# Patient Record
Sex: Male | Born: 1960 | Race: White | Hispanic: No | Marital: Single | Smoking: Current every day smoker
Health system: Southern US, Community
[De-identification: ages and names within clinical notes are randomized; demographics above are authoritative.]

## PROBLEM LIST (undated history)

## (undated) DIAGNOSIS — E119 Type 2 diabetes mellitus without complications: Secondary | ICD-10-CM

## (undated) DIAGNOSIS — D3A00 Benign carcinoid tumor of unspecified site: Secondary | ICD-10-CM

## (undated) DIAGNOSIS — G4730 Sleep apnea, unspecified: Secondary | ICD-10-CM

## (undated) DIAGNOSIS — J45909 Unspecified asthma, uncomplicated: Secondary | ICD-10-CM

## (undated) DIAGNOSIS — I10 Essential (primary) hypertension: Secondary | ICD-10-CM

## (undated) HISTORY — PX: CARDIAC SURGERY: SHX584

## (undated) HISTORY — PX: CHOLECYSTECTOMY: SHX55

## (undated) HISTORY — PX: TONSILLECTOMY: SUR1361

## (undated) HISTORY — DX: Benign carcinoid tumor of unspecified site: D3A.00

## (undated) HISTORY — PX: APPENDECTOMY: SHX54

---

## 2014-03-16 ENCOUNTER — Encounter (HOSPITAL_COMMUNITY): Payer: Self-pay | Admitting: Emergency Medicine

## 2014-03-16 ENCOUNTER — Emergency Department (HOSPITAL_COMMUNITY): Payer: BC Managed Care – PPO

## 2014-03-16 ENCOUNTER — Emergency Department (HOSPITAL_COMMUNITY)
Admission: EM | Admit: 2014-03-16 | Discharge: 2014-03-17 | Disposition: A | Discharge status: Home / Self Care | Payer: BC Managed Care – PPO | Attending: Emergency Medicine | Admitting: Emergency Medicine

## 2014-03-16 DIAGNOSIS — R141 Gas pain: Secondary | ICD-10-CM | POA: Insufficient documentation

## 2014-03-16 DIAGNOSIS — J45909 Unspecified asthma, uncomplicated: Secondary | ICD-10-CM | POA: Insufficient documentation

## 2014-03-16 DIAGNOSIS — K611 Rectal abscess: Secondary | ICD-10-CM

## 2014-03-16 DIAGNOSIS — Z880 Allergy status to penicillin: Secondary | ICD-10-CM | POA: Insufficient documentation

## 2014-03-16 DIAGNOSIS — K612 Anorectal abscess: Secondary | ICD-10-CM | POA: Insufficient documentation

## 2014-03-16 DIAGNOSIS — R142 Eructation: Secondary | ICD-10-CM

## 2014-03-16 DIAGNOSIS — M549 Dorsalgia, unspecified: Secondary | ICD-10-CM | POA: Insufficient documentation

## 2014-03-16 DIAGNOSIS — R1084 Generalized abdominal pain: Secondary | ICD-10-CM | POA: Insufficient documentation

## 2014-03-16 DIAGNOSIS — F17200 Nicotine dependence, unspecified, uncomplicated: Secondary | ICD-10-CM | POA: Insufficient documentation

## 2014-03-16 DIAGNOSIS — R143 Flatulence: Secondary | ICD-10-CM

## 2014-03-16 DIAGNOSIS — R3919 Other difficulties with micturition: Secondary | ICD-10-CM | POA: Insufficient documentation

## 2014-03-16 HISTORY — DX: Unspecified asthma, uncomplicated: J45.909

## 2014-03-16 MED ORDER — SODIUM CHLORIDE 0.9 % IV BOLUS (SEPSIS)
1000.0000 mL | Freq: Once | INTRAVENOUS | Status: AC
  Administered 2014-03-17: 1000 mL via INTRAVENOUS

## 2014-03-16 MED ORDER — MORPHINE SULFATE 4 MG/ML IJ SOLN
4.0000 mg | Freq: Once | INTRAMUSCULAR | Status: AC
  Administered 2014-03-17: 4 mg via INTRAVENOUS
  Filled 2014-03-16: qty 1

## 2014-03-16 NOTE — ED Provider Notes (Signed)
CSN: [id number]     Arrival date & time 03/16/14  2002 History  This chart was scribed for Julianne Rice, MD by Rolanda Lundborg, ED Scribe. This patient was seen in room APA11/APA11 and the patient's care was started at 11:10 PM.    Chief Complaint  Patient presents with  . Rectal Pain   The history is provided by the patient. No language interpreter was used.   HPI Comments: Jacob Orozco is a 53 y.o. male who presents to the Emergency Department complaining of moderate to severe rectal pain onset 4 days ago with associated constipation. Pain worsens with ambulation. He reports taking laxatives with no relief from constipation. He was seen by PCP today who suspected anal fissures and gave Linzess. He denies fevers, chills, abdominal pain.    Past Medical History  Diagnosis Date  . Asthma    Past Surgical History  Procedure Laterality Date  . Cholecystectomy    . Appendectomy    . Tonsillectomy     History reviewed. No pertinent family history. History  Substance Use Topics  . Smoking status: Current Every Day Smoker -- 0.50 packs/day    Types: Cigarettes  . Smokeless tobacco: Not on file  . Alcohol Use: No    Review of Systems  Constitutional: Negative for fever and chills.  Gastrointestinal: Positive for constipation and rectal pain. Negative for nausea, vomiting and abdominal pain.  Genitourinary: Positive for difficulty urinating. Negative for dysuria and frequency.  Musculoskeletal: Positive for back pain. Negative for neck pain and neck stiffness.  Skin: Negative for rash and wound.  Neurological: Negative for dizziness, weakness, light-headedness, numbness and headaches.  All other systems reviewed and are negative.     Allergies  Penicillins  Home Medications   Prior to Admission medications   Not on File   BP 146/68  Pulse 113  Temp(Src) 98.3 F (36.8 C) (Oral)  Resp 20  Ht 5\' 10"  (1.778 m)  Wt 260 lb (117.935 kg)  BMI 37.31 kg/m2  SpO2  97% Physical Exam  Nursing note and vitals reviewed. Constitutional: He is oriented to person, place, and time. He appears well-developed and well-nourished. He appears distressed.  HENT:  Head: Normocephalic and atraumatic.  Mouth/Throat: Oropharynx is clear and moist. No oropharyngeal exudate.  Eyes: EOM are normal. Pupils are equal, round, and reactive to light.  Neck: Normal range of motion. Neck supple.  Cardiovascular: Normal rate and regular rhythm.   Pulmonary/Chest: Effort normal and breath sounds normal. No respiratory distress. He has no wheezes. He has no rales. He exhibits no tenderness.  Abdominal: Soft. Bowel sounds are normal. He exhibits distension. He exhibits no mass. There is tenderness (mild diffuse abdominal tenderness without focality.). There is no rebound and no guarding.  Genitourinary:  No anal fissures visualized. Very painful rectal exam. No impaction. No definite masses.  Musculoskeletal: Normal range of motion. He exhibits no edema and no tenderness.  No CVA tenderness. No midline thoracic or lumbar tenderness.  Neurological: He is alert and oriented to person, place, and time.  5/5 motor in all extremities. Sensation is intact. Good rectal tone.  Skin: Skin is warm and dry. No rash noted. No erythema.  Psychiatric: He has a normal mood and affect. His behavior is normal.    ED Course  Procedures (including critical care time) Medications  metroNIDAZOLE (FLAGYL) IVPB 500 mg (500 mg Intravenous New Bag/Given 03/17/14 0551)  morphine 4 MG/ML injection 4 mg (4 mg Intravenous Given 03/17/14 0053)  sodium  chloride 0.9 % bolus 1,000 mL (0 mLs Intravenous Stopped 03/17/14 0230)  ondansetron (ZOFRAN) injection 4 mg (4 mg Intravenous Given 03/17/14 0054)  ondansetron (ZOFRAN) 4 MG/2ML injection (  Duplicate 04/02/29 1601)  iohexol (OMNIPAQUE) 300 MG/ML solution 50 mL (50 mLs Oral Contrast Given 03/17/14 0212)  iohexol (OMNIPAQUE) 300 MG/ML solution 100 mL (100 mLs  Intravenous Contrast Given 03/17/14 0212)  morphine 4 MG/ML injection 4 mg (4 mg Intravenous Given 03/17/14 0249)  ciprofloxacin (CIPRO) IVPB 400 mg (0 mg Intravenous Stopped 03/17/14 0550)  oxyCODONE-acetaminophen (PERCOCET/ROXICET) 5-325 MG per tablet 2 tablet (2 tablets Oral Given 03/17/14 0445)    DIAGNOSTIC STUDIES: Oxygen Saturation is 97% on RA, normal by my interpretation.    COORDINATION OF CARE: 11:41 PM- Discussed treatment plan with pt which includes CT abdomen, pain control, IV fluids, blood work, UA. Pt agrees to plan.    Labs Review Labs Reviewed  CBC WITH DIFFERENTIAL - Abnormal; Notable for the following:    WBC 14.4 (*)    Hemoglobin 12.7 (*)    HCT 38.3 (*)    Neutro Abs 10.3 (*)    Monocytes Absolute 1.6 (*)    All other components within normal limits  COMPREHENSIVE METABOLIC PANEL - Abnormal; Notable for the following:    Glucose, Bld 136 (*)    Albumin 3.2 (*)    GFR calc non Af Amer 80 (*)    All other components within normal limits  URINALYSIS, ROUTINE W REFLEX MICROSCOPIC    Imaging Review Ct Abdomen Pelvis W Contrast  03/17/2014   ADDENDUM REPORT: 03/17/2014 02:59  ADDENDUM: Bilateral low-density adrenal masses with indeterminate imaging features, if there is not a history of cancer, these are likely benign and, 12 month follow-up CT is recommended.   Electronically Signed   By: Elon Alas   On: 03/17/2014 02:59   03/17/2014   CLINICAL DATA:  Rectal and lower abdominal pain. History of cholecystectomy and appendectomy.  EXAM: CT ABDOMEN AND PELVIS WITH CONTRAST  TECHNIQUE: Multidetector CT imaging of the abdomen and pelvis was performed using the standard protocol following bolus administration of intravenous contrast.  CONTRAST:  70mL OMNIPAQUE IOHEXOL 300 MG/ML SOLN, 163mL OMNIPAQUE IOHEXOL 300 MG/ML SOLN  COMPARISON:  None.  FINDINGS: Included view of the lung bases are clear. Visualized heart and pericardium are unremarkable.  The spleen, pancreas  are unremarkable. Status post cholecystectomy. The liver appears upper limits of normal in size, and is otherwise unremarkable. Homogeneous hypodense bilateral adrenal nodules, largest on the left measuring 2 x 3.7 cm (20 Hounsfield units).  Inflammatory changes about the rectum, with superimposed left perianal 3.3 x 2.4 cm fluid collection, axial 87/108. New subcutaneous gas The stomach, small and large bowel are normal in course and caliber without inflammatory changes. Status post appendectomy. No intraperitoneal free fluid nor free air.  Kidneys are orthotopic, demonstrating symmetric enhancement without nephrolithiasis, hydronephrosis or renal masses. The unopacified ureters are normal in course and caliber. Delayed imaging through the kidneys demonstrates symmetric prompt excretion to the proximal urinary collecting system. Urinary bladder is partially distended and unremarkable.  Aortoiliac vessels are normal in course and caliber with mild calcific atherosclerosis. . No lymphadenopathy by CT size criteria. Internal reproductive organs are unremarkable. The soft tissues and included osseous structures are nonsuspicious. Fragmented left superolateral acetabulum may be degenerative or, reflect os acetabulum.  IMPRESSION: 3.3 x 2.4 cm perianal abscess without intraperitoneal extent, and rectal inflammatory changes. No bowel obstruction.  Electronically Signed: By: Elon Alas  On: 03/17/2014 02:47     EKG Interpretation None      MDM   Final diagnoses:  Peri-rectal abscess    I personally performed the services described in this documentation, which was scribed in my presence. The recorded information has been reviewed and is accurate.  Discussed findings with Dr. Arnoldo Morale. He states the abscess is too small to drain at this point. He suggests antibiotics and pain control.  Julianne Rice, MD 03/17/14 0630

## 2014-03-16 NOTE — ED Notes (Signed)
I went to the MD today, and He said that I was constipated and put me on medications to help me go to the bathroom. States that he is having pain in the rectal area and that it hurts to walk.

## 2014-03-17 LAB — COMPREHENSIVE METABOLIC PANEL
ALT: 27 U/L (ref 0–53)
AST: 17 U/L (ref 0–37)
Albumin: 3.2 g/dL — ABNORMAL LOW (ref 3.5–5.2)
Alkaline Phosphatase: 66 U/L (ref 39–117)
BUN: 16 mg/dL (ref 6–23)
CALCIUM: 8.9 mg/dL (ref 8.4–10.5)
CO2: 25 mEq/L (ref 19–32)
CREATININE: 1.05 mg/dL (ref 0.50–1.35)
Chloride: 103 mEq/L (ref 96–112)
GFR calc Af Amer: >90 mL/min (ref >90)
GFR, EST NON AFRICAN AMERICAN: 80 mL/min — AB (ref >90)
Glucose, Bld: 136 mg/dL — ABNORMAL HIGH (ref 70–99)
Potassium: 4.2 mEq/L (ref 3.7–5.3)
SODIUM: 141 meq/L (ref 137–147)
TOTAL PROTEIN: 6.8 g/dL (ref 6.0–8.3)
Total Bilirubin: 0.3 mg/dL (ref 0.3–1.2)

## 2014-03-17 LAB — CBC WITH DIFFERENTIAL/PLATELET
Basophils Absolute: 0 10*3/uL (ref 0.0–0.1)
Basophils Relative: 0 % (ref 0–1)
EOS ABS: 0.1 10*3/uL (ref 0.0–0.7)
Eosinophils Relative: 1 % (ref 0–5)
HCT: 38.3 % — ABNORMAL LOW (ref 39.0–52.0)
Hemoglobin: 12.7 g/dL — ABNORMAL LOW (ref 13.0–17.0)
LYMPHS ABS: 2.4 10*3/uL (ref 0.7–4.0)
Lymphocytes Relative: 17 % (ref 12–46)
MCH: 28.7 pg (ref 26.0–34.0)
MCHC: 33.2 g/dL (ref 30.0–36.0)
MCV: 86.5 fL (ref 78.0–100.0)
MONO ABS: 1.6 10*3/uL — AB (ref 0.1–1.0)
Monocytes Relative: 11 % (ref 3–12)
NEUTROS PCT: 71 % (ref 43–77)
Neutro Abs: 10.3 10*3/uL — ABNORMAL HIGH (ref 1.7–7.7)
PLATELETS: 235 10*3/uL (ref 150–400)
RBC: 4.43 MIL/uL (ref 4.22–5.81)
RDW: 14.4 % (ref 11.5–15.5)
WBC: 14.4 10*3/uL — ABNORMAL HIGH (ref 4.0–10.5)

## 2014-03-17 MED ORDER — CIPROFLOXACIN HCL 500 MG PO TABS: 500.0000 mg | ORAL_TABLET | Freq: Two times a day (BID) | ORAL | Status: DC

## 2014-03-17 MED ORDER — OXYCODONE-ACETAMINOPHEN 5-325 MG PO TABS: 2.0000 | ORAL_TABLET | ORAL | Status: DC | PRN

## 2014-03-17 MED ORDER — ONDANSETRON HCL 4 MG/2ML IJ SOLN
INTRAMUSCULAR | Status: AC
  Filled 2014-03-17: qty 2

## 2014-03-17 MED ORDER — CIPROFLOXACIN IN D5W 400 MG/200ML IV SOLN
400.0000 mg | Freq: Once | INTRAVENOUS | Status: AC
  Administered 2014-03-17: 400 mg via INTRAVENOUS
  Filled 2014-03-17: qty 200

## 2014-03-17 MED ORDER — METRONIDAZOLE IN NACL 5-0.79 MG/ML-% IV SOLN
500.0000 mg | Freq: Once | INTRAVENOUS | Status: AC
  Administered 2014-03-17: 500 mg via INTRAVENOUS
  Filled 2014-03-17: qty 100

## 2014-03-17 MED ORDER — IOHEXOL 300 MG/ML  SOLN
100.0000 mL | Freq: Once | INTRAMUSCULAR | Status: AC | PRN
  Administered 2014-03-17: 100 mL via INTRAVENOUS

## 2014-03-17 MED ORDER — MORPHINE SULFATE 4 MG/ML IJ SOLN
4.0000 mg | Freq: Once | INTRAMUSCULAR | Status: AC
Start: 2014-03-17 — End: 2014-03-17
  Administered 2014-03-17: 4 mg via INTRAVENOUS
  Filled 2014-03-17: qty 1

## 2014-03-17 MED ORDER — ONDANSETRON 4 MG PO TBDP: ORAL_TABLET | ORAL | Status: DC

## 2014-03-17 MED ORDER — OXYCODONE-ACETAMINOPHEN 5-325 MG PO TABS
2.0000 | ORAL_TABLET | Freq: Once | ORAL | Status: AC
  Administered 2014-03-17: 2 via ORAL
  Filled 2014-03-17: qty 2

## 2014-03-17 MED ORDER — ONDANSETRON HCL 4 MG/2ML IJ SOLN
4.0000 mg | Freq: Once | INTRAMUSCULAR | Status: AC
  Administered 2014-03-17: 4 mg via INTRAVENOUS

## 2014-03-17 MED ORDER — METRONIDAZOLE 500 MG PO TABS: 500.0000 mg | ORAL_TABLET | Freq: Two times a day (BID) | ORAL | Status: DC

## 2014-03-17 MED ORDER — IOHEXOL 300 MG/ML  SOLN
50.0000 mL | Freq: Once | INTRAMUSCULAR | Status: AC | PRN
  Administered 2014-03-17: 50 mL via ORAL

## 2014-03-17 NOTE — Discharge Instructions (Signed)
Peri-Rectal Abscess °Your caregiver has diagnosed you as having a peri-rectal abscess. This is an infected area near the rectum that is filled with pus. If the abscess is near the surface of the skin, your caregiver may open (incise) the area and drain the pus. °HOME CARE INSTRUCTIONS  °· If your abscess was opened up and drained. A small piece of gauze may be placed in the opening so that it can drain. Do not remove the gauze unless directed by your caregiver. °· A loose dressing may be placed over the abscess site. Change the dressing as often as necessary to keep it clean and dry. °· After the drain is removed, the area may be washed with a gentle antiseptic (soap) four times per day. °· A warm sitz bath, warm packs or heating pad may be used for pain relief, taking care not to burn yourself. °· Return for a wound check in 1 day or as directed. °· An "inflatable doughnut" may be used for sitting with added comfort. These can be purchased at a drugstore or medical supply house. °· To reduce pain and straining with bowel movements, eat a high fiber diet with plenty of fruits and vegetables. Use stool softeners as recommended by your caregiver. This is especially important if narcotic type pain medications were prescribed as these may cause marked constipation. °· Only take over-the-counter or prescription medicines for pain, discomfort, or fever as directed by your caregiver. °SEEK IMMEDIATE MEDICAL CARE IF:  °· You have increasing pain that is not controlled by medication. °· There is increased inflammation (redness), swelling, bleeding, or drainage from the area. °· An oral temperature above 102° F (38.9° C) develops. °· You develop chills or generalized malaise (feel lethargic or feel "washed out"). °· You develop any new symptoms (problems) you feel may be related to your present problem. °Document Released: 10/11/2000 Document Revised: 01/06/2012 Document Reviewed: 10/11/2008 °Exit[employer]® Patient Information  ©2014 [employer]. ° °

## 2020-06-21 DIAGNOSIS — R911 Solitary pulmonary nodule: Secondary | ICD-10-CM | POA: Insufficient documentation

## 2020-08-31 LAB — PULMONARY FUNCTION TEST

## 2020-09-11 ENCOUNTER — Other Ambulatory Visit: Payer: Self-pay

## 2020-09-12 ENCOUNTER — Encounter: Payer: Self-pay | Admitting: *Deleted

## 2020-09-12 ENCOUNTER — Other Ambulatory Visit: Payer: Self-pay | Admitting: *Deleted

## 2020-09-12 ENCOUNTER — Institutional Professional Consult (permissible substitution) (INDEPENDENT_AMBULATORY_CARE_PROVIDER_SITE_OTHER): Payer: BC Managed Care – PPO | Admitting: Thoracic Surgery (Cardiothoracic Vascular Surgery)

## 2020-09-12 ENCOUNTER — Other Ambulatory Visit: Payer: Self-pay

## 2020-09-12 VITALS — BP 111/67 | HR 78 | Temp 97.7°F | Resp 20 | Ht 70.0 in | Wt 285.0 lb

## 2020-09-12 DIAGNOSIS — E119 Type 2 diabetes mellitus without complications: Secondary | ICD-10-CM | POA: Insufficient documentation

## 2020-09-12 DIAGNOSIS — I10 Essential (primary) hypertension: Secondary | ICD-10-CM | POA: Diagnosis not present

## 2020-09-12 DIAGNOSIS — G4733 Obstructive sleep apnea (adult) (pediatric): Secondary | ICD-10-CM

## 2020-09-12 DIAGNOSIS — E1140 Type 2 diabetes mellitus with diabetic neuropathy, unspecified: Secondary | ICD-10-CM | POA: Insufficient documentation

## 2020-09-12 DIAGNOSIS — E662 Morbid (severe) obesity with alveolar hypoventilation: Secondary | ICD-10-CM

## 2020-09-12 DIAGNOSIS — E785 Hyperlipidemia, unspecified: Secondary | ICD-10-CM | POA: Insufficient documentation

## 2020-09-12 DIAGNOSIS — R918 Other nonspecific abnormal finding of lung field: Secondary | ICD-10-CM | POA: Diagnosis not present

## 2020-09-12 DIAGNOSIS — R911 Solitary pulmonary nodule: Secondary | ICD-10-CM

## 2020-09-12 DIAGNOSIS — E1142 Type 2 diabetes mellitus with diabetic polyneuropathy: Secondary | ICD-10-CM

## 2020-09-12 DIAGNOSIS — J449 Chronic obstructive pulmonary disease, unspecified: Secondary | ICD-10-CM | POA: Insufficient documentation

## 2020-09-12 DIAGNOSIS — G4730 Sleep apnea, unspecified: Secondary | ICD-10-CM | POA: Insufficient documentation

## 2020-09-12 NOTE — H&P (View-Only) (Signed)
PCP is No primary care provider on file. Referring Provider is Andres Shad, *  Chief Complaint  Patient presents with  . Lung Mass    Surgical consult/2nd opionion, Chest CT,08/16/20,  PET Scan 06/21/20, PFT's 08/16/20    HPI: [name] is sent for consultation regarding a right lower lobe lung nodule.  Jacob Orozco is a 59 year old man with a history of tobacco abuse, COPD, obesity, sleep apnea, type 2 diabetes complicated by neuropathy, hypertension, and hyperlipidemia.  He presented back in July with shortness of breath.  He was diagnosed with pneumonia.  He was treated with antibiotics.  During that time he was found to have a right lower lobe lung nodule which was confirmed on CT.  It showed a cylindrical 3.6 x 2.1 cm right lower lobe nodule.  There was no mediastinal or hilar adenopathy.  He had a PET/CT which showed activity below the level of the blood pool.  A follow-up CT was done at 3 months which showed little if any change in size.  He was seen in consultation by Dr. Wynelle Cleveland and was advised to have a right lower lobectomy.  He recommended pulmonary rehab prior to surgery.  He was unable to get pulmonary rehab and damp also has been walking on his own.  He is now seeking a second opinion.  He does get short of breath with exertion.  He can walk up a flight of stairs but would stop at the top to catch his breath.  He walks a lot at work at an [employer] ([employer]).  He has been trying to lose weight and has lost about 10 pounds in the past 3 months.  He has a lot of problems with diabetic neuropathy.  He is not having any chest pain, pressure, or tightness.  Does complain of decreased appetite and decreased energy.   Past Medical History:  Diagnosis Date  . Asthma    Patient Active Problem List   Diagnosis Date Noted  . Obesity with alveolar hypoventilation and body mass index (BMI) of 40 or greater ([place]) 09/12/2020  . Sleep apnea 09/12/2020  . Hyperlipidemia 09/12/2020  .  Essential hypertension 09/12/2020  . COPD (chronic obstructive pulmonary disease) ([place]) 09/12/2020  . Type 2 diabetes mellitus ([place]) 09/12/2020  . Diabetic neuropathy associated with type 2 diabetes mellitus ([place]) 09/12/2020  . Lung nodule 06/21/2020    Past Surgical History:  Procedure Laterality Date  . APPENDECTOMY    . CHOLECYSTECTOMY    . TONSILLECTOMY      No family history on file.  Social History Social History   Tobacco Use  . Smoking status: Current Every Day Smoker    Packs/day: 0.50    Types: Cigarettes  Substance Use Topics  . Alcohol use: No  . Drug use: No    Current Outpatient Medications  Medication Sig Dispense Refill  . amLODipine-valsartan (EXFORGE) 5-160 MG tablet Take 1 tablet by mouth daily.    Marland Kitchen atorvastatin (LIPITOR) 10 MG tablet Take by mouth daily.     . Fluticasone-Umeclidin-Vilant (TRELEGY ELLIPTA) 100-62.5-25 MCG/INH AEPB Inhale 1 Inhaler into the lungs in the morning.    . hydrOXYzine (ATARAX/VISTARIL) 10 MG tablet Take 10 mg by mouth at bedtime.     . metFORMIN (GLUMETZA) 500 MG (MOD) 24 hr tablet Take 500 mg by mouth 2 (two) times daily with a meal.     . albuterol (ACCUNEB) 0.63 MG/3ML nebulizer solution every 4 (four) hours as needed (Patient not taking: Reported on  09/12/2020)    . Fluticasone-Umeclidin-Vilant 100-62.5-25 MCG/INH AEPB Inhale into the lungs. (Patient not taking: Reported on 09/12/2020)    . ondansetron (ZOFRAN ODT) 4 MG disintegrating tablet 4mg  ODT q4 hours prn nausea/vomit (Patient not taking: Reported on 09/12/2020) 8 tablet 0  . oxyCODONE-acetaminophen (PERCOCET) 5-325 MG per tablet Take 2 tablets by mouth every 4 (four) hours as needed. (Patient not taking: Reported on 09/12/2020) 20 tablet 0   No current facility-administered medications for this visit.    Allergies  Allergen Reactions  . Penicillins Anaphylaxis  . Codeine Anxiety    hyperactive    Review of Systems  Constitutional: Positive for activity  change, appetite change, fatigue and unexpected weight change.  HENT: Negative for trouble swallowing and voice change.   Respiratory: Positive for apnea (CPAP at night), shortness of breath and wheezing.   Cardiovascular: Positive for leg swelling. Negative for chest pain.  Gastrointestinal: Negative for abdominal distention and abdominal pain.  Genitourinary: Positive for frequency.  Musculoskeletal: Negative for arthralgias and myalgias.  Skin: Positive for rash.       Itching  Neurological: Positive for numbness.  Hematological: Negative for adenopathy. Does not bruise/bleed easily.    BP 111/67   Pulse 78   Temp 97.7 F (36.5 C) (Skin)   Resp 20   Ht 5\' 10"  (1.778 m)   Wt 285 lb (129.3 kg)   SpO2 97% Comment: RA  BMI 40.89 kg/m  Physical Exam Vitals reviewed.  Constitutional:      General: He is not in acute distress.    Appearance: He is obese.  HENT:     Head: Normocephalic and atraumatic.  Eyes:     General: No scleral icterus.    Extraocular Movements: Extraocular movements intact.  Cardiovascular:     Rate and Rhythm: Normal rate and regular rhythm.     Pulses: Normal pulses.     Heart sounds: Normal heart sounds. No murmur heard.  No friction rub. No gallop.   Pulmonary:     Effort: Pulmonary effort is normal. No respiratory distress.     Breath sounds: No wheezing or rales.     Comments: Diminished breath sounds bilaterally Abdominal:     General: There is no distension.     Palpations: Abdomen is soft.     Tenderness: There is no abdominal tenderness.  Musculoskeletal:     Cervical back: Neck supple.     Comments: Venous stasis changes both lower extremities  Lymphadenopathy:     Cervical: No cervical adenopathy.  Skin:    General: Skin is warm and dry.  Neurological:     General: No focal deficit present.     Mental Status: He is alert and oriented to person, place, and time.     Cranial Nerves: No cranial nerve deficit.     Motor: No weakness.     Diagnostic Tests: CT chest [facility] 08/16/2020 Impression  1.  Similar right lower lobe lobulated pulmonary mass.  This mass was not FDG avid on prior PET/CT.  Indolent neoplasms remains a possibility.  However, findings may represent mucus impaction possibly in the setting of more proximal obstruction by a (sic) endobronchial lesion.  Consider comparison with prior outside CT.  Additionally a 11-month follow-up contrast-enhanced CT of the chest is suggested, to assess for enhancing component to assess stability. 2.  Stable nonspecific bilateral axillary lymph nodes.  Recommend attention on follow-up. Runell Gess, MD  PET/CT [facility] 06/21/2020 Impression 1.  Right lower lobe  pulmonary nodule does not demonstrate high-grade FDG avidity.  Correlation with prior studies is recommended to assess for stability.  Differential considerations include infectious/inflammatory etiology versus indolent neoplasm (i.e. adenocarcinoma).  Close attention on CT follow-up is recommended. 2.  Enlarged bilateral axillary lymph nodes with hypermetabolic activity are nonspecific.  A reactive process is possible, but neoplastic involvement is not excluded.  These would likely be amenable to ultrasound-guided biopsy if indicated. Francesca Jewett, MD  Pulmonary function testing FVC 2.81 (62%) FEV1 1.97 (58%) DLCO 19.33 (71%) No airway obstruction.  Moderate restrictive disease.  Diffusion capacity mildly reduced.  I personally reviewed the CT images and concur with the findings noted above PET/CT and pulmonary function reports also reviewed  Impression: Jacob Orozco is a 59 year old man with a history of tobacco abuse, COPD, obesity, sleep apnea, type 2 diabetes complicated by neuropathy, hypertension, and hyperlipidemia.  He was found to have a right lower lobe lung nodule back in July after presenting with shortness of breath and being treated for pneumonia.  CT showed a 3.7 x 2.1 cm cylindrical  "mass" in the central right lower lobe.  On PET CT there was minimal metabolic activity.  On follow-up chest CT there was essentially no change or possibly very slight enlargement.  I reviewed the CT images carefully it appears there is a 1.5 cm well-circumscribed lesion centered on her bronchus with likely mucus impaction behind that.  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  Hamartoma is possible but this seems more endobronchial than parenchymal.  Other benign tumors are possible but far less likely.  I discussed 3 potential options with Mr. Chew.  He is accompanied by his ex-wife who is his best friend.  The first would be radiographic follow-up.  I do not think this is going to resolve.  There is no indication that this would turn aggressive in the short-term, but I do think he is at risk for repeated infections and ultimately is going to need resection.  Second option would be to do a navigational bronchoscopy to sample the lesion.  I think it would be relatively easy to get to the lesion but there is always the possibility of sampling error.  I do not think it would change the recommendations I think this nodule is going to need to come out one way or the other.  The final option would be surgical resection.  Obviously this is the most aggressive approach.  I think he will end up having a resection eventually, regardless of the initial approach.  I discussed the proposed operation with Mr. Hull.  He understands the general nature of the procedure including the need for general anesthesia, the incisions to be used, the use of a drainage tube postoperatively, the expected hospital stay, and the overall recovery.  I informed him of the indications, risks, benefits, and alternatives.  He understands the risks include, but not limited to death, MI, DVT, PE, bleeding, possible need for transfusion, infection, prolonged air leak, cardiac  arrhythmias, as well as possibility of other unforeseeable complications.  We will try to preserve some of the lower lobe if we can, however think given the central location a lobectomy will probably be necessary.  Tobacco abuse-he says he quit smoking in August.  Importance of cessation was emphasized.  Plan: Robotic assisted right thoracoscopy for lower lobectomy.  My office will contact patient to schedule  I spent over 45 minutes in review of  records, review of images, and in consultation with Mr. Aliberti today. Melrose Nakayama, MD [facility] [phone]

## 2020-09-12 NOTE — Progress Notes (Signed)
PCP is No primary care provider on file. Referring Provider is Andres Shad, *  Chief Complaint  Patient presents with  . Lung Mass    Surgical consult/2nd opionion, Chest CT,08/16/20,  PET Scan 06/21/20, PFT's 08/16/20    HPI: Jacob Orozco is sent for consultation regarding a right lower lobe lung nodule.  Jacob Orozco is a 59 year old man with a history of tobacco abuse, COPD, obesity, sleep apnea, type 2 diabetes complicated by neuropathy, hypertension, and hyperlipidemia.  He presented back in July with shortness of breath.  He was diagnosed with pneumonia.  He was treated with antibiotics.  During that time he was found to have a right lower lobe lung nodule which was confirmed on CT.  It showed a cylindrical 3.6 x 2.1 cm right lower lobe nodule.  There was no mediastinal or hilar adenopathy.  He had a PET/CT which showed activity below the level of the blood pool.  A follow-up CT was done at 3 months which showed little if any change in size.  He was seen in consultation by Dr. Wynelle Cleveland and was advised to have a right lower lobectomy.  He recommended pulmonary rehab prior to surgery.  He was unable to get pulmonary rehab and damp also has been walking on his own.  He is now seeking a second opinion.  He does get short of breath with exertion.  He can walk up a flight of stairs but would stop at the top to catch his breath.  He walks a lot at work at an [employer] (life [employer]).  He has been trying to lose weight and has lost about 10 pounds in the past 3 months.  He has a lot of problems with diabetic neuropathy.  He is not having any chest pain, pressure, or tightness.  Does complain of decreased appetite and decreased energy.   Past Medical History:  Diagnosis Date  . Asthma    Patient Active Problem List   Diagnosis Date Noted  . Obesity with alveolar hypoventilation and body mass index (BMI) of 40 or greater ([place]) 09/12/2020  . Sleep apnea 09/12/2020  . Hyperlipidemia 09/12/2020  .  Essential hypertension 09/12/2020  . COPD (chronic obstructive pulmonary disease) ([place]) 09/12/2020  . Type 2 diabetes mellitus ([place]) 09/12/2020  . Diabetic neuropathy associated with type 2 diabetes mellitus ([place]) 09/12/2020  . Lung nodule 06/21/2020    Past Surgical History:  Procedure Laterality Date  . APPENDECTOMY    . CHOLECYSTECTOMY    . TONSILLECTOMY      No family history on file.  Social History Social History   Tobacco Use  . Smoking status: Current Every Day Smoker    Packs/day: 0.50    Types: Cigarettes  Substance Use Topics  . Alcohol use: No  . Drug use: No    Current Outpatient Medications  Medication Sig Dispense Refill  . amLODipine-valsartan (EXFORGE) 5-160 MG tablet Take 1 tablet by mouth daily.    Marland Kitchen atorvastatin (LIPITOR) 10 MG tablet Take by mouth daily.     . Fluticasone-Umeclidin-Vilant (TRELEGY ELLIPTA) 100-62.5-25 MCG/INH AEPB Inhale 1 Inhaler into the lungs in the morning.    . hydrOXYzine (ATARAX/VISTARIL) 10 MG tablet Take 10 mg by mouth at bedtime.     . metFORMIN (GLUMETZA) 500 MG (MOD) 24 hr tablet Take 500 mg by mouth 2 (two) times daily with a meal.     . albuterol (ACCUNEB) 0.63 MG/3ML nebulizer solution every 4 (four) hours as needed (Patient not taking: Reported on  09/12/2020)    . Fluticasone-Umeclidin-Vilant 100-62.5-25 MCG/INH AEPB Inhale into the lungs. (Patient not taking: Reported on 09/12/2020)    . ondansetron (ZOFRAN ODT) 4 MG disintegrating tablet 4mg  ODT q4 hours prn nausea/vomit (Patient not taking: Reported on 09/12/2020) 8 tablet 0  . oxyCODONE-acetaminophen (PERCOCET) 5-325 MG per tablet Take 2 tablets by mouth every 4 (four) hours as needed. (Patient not taking: Reported on 09/12/2020) 20 tablet 0   No current facility-administered medications for this visit.    Allergies  Allergen Reactions  . Penicillins Anaphylaxis  . Codeine Anxiety    hyperactive    Review of Systems  Constitutional: Positive for activity  change, appetite change, fatigue and unexpected weight change.  HENT: Negative for trouble swallowing and voice change.   Respiratory: Positive for apnea (CPAP at night), shortness of breath and wheezing.   Cardiovascular: Positive for leg swelling. Negative for chest pain.  Gastrointestinal: Negative for abdominal distention and abdominal pain.  Genitourinary: Positive for frequency.  Musculoskeletal: Negative for arthralgias and myalgias.  Skin: Positive for rash.       Itching  Neurological: Positive for numbness.  Hematological: Negative for adenopathy. Does not bruise/bleed easily.    BP 111/67   Pulse 78   Temp 97.7 F (36.5 C) (Skin)   Resp 20   Ht 5\' 10"  (1.778 m)   Wt 285 lb (129.3 kg)   SpO2 97% Comment: RA  BMI 40.89 kg/m  Physical Exam Vitals reviewed.  Constitutional:      General: He is not in acute distress.    Appearance: He is obese.  HENT:     Head: Normocephalic and atraumatic.  Eyes:     General: No scleral icterus.    Extraocular Movements: Extraocular movements intact.  Cardiovascular:     Rate and Rhythm: Normal rate and regular rhythm.     Pulses: Normal pulses.     Heart sounds: Normal heart sounds. No murmur heard.  No friction rub. No gallop.   Pulmonary:     Effort: Pulmonary effort is normal. No respiratory distress.     Breath sounds: No wheezing or rales.     Comments: Diminished breath sounds bilaterally Abdominal:     General: There is no distension.     Palpations: Abdomen is soft.     Tenderness: There is no abdominal tenderness.  Musculoskeletal:     Cervical back: Neck supple.     Comments: Venous stasis changes both lower extremities  Lymphadenopathy:     Cervical: No cervical adenopathy.  Skin:    General: Skin is warm and dry.  Neurological:     General: No focal deficit present.     Mental Status: He is alert and oriented to person, place, and time.     Cranial Nerves: No cranial nerve deficit.     Motor: No weakness.     Diagnostic Tests: CT chest [facility] 08/16/2020 Impression  1.  Similar right lower lobe lobulated pulmonary mass.  This mass was not FDG avid on prior PET/CT.  Indolent neoplasms remains a possibility.  However, findings may represent mucus impaction possibly in the setting of more proximal obstruction by a (sic) endobronchial lesion.  Consider comparison with prior outside CT.  Additionally a 54-month follow-up contrast-enhanced CT of the chest is suggested, to assess for enhancing component to assess stability. 2.  Stable nonspecific bilateral axillary lymph nodes.  Recommend attention on follow-up. Runell Gess, MD  PET/CT [facility] 06/21/2020 Impression 1.  Right lower lobe  pulmonary nodule does not demonstrate high-grade FDG avidity.  Correlation with prior studies is recommended to assess for stability.  Differential considerations include infectious/inflammatory etiology versus indolent neoplasm (i.e. adenocarcinoma).  Close attention on CT follow-up is recommended. 2.  Enlarged bilateral axillary lymph nodes with hypermetabolic activity are nonspecific.  A reactive process is possible, but neoplastic involvement is not excluded.  These would likely be amenable to ultrasound-guided biopsy if indicated. Francesca Jewett, MD  Pulmonary function testing FVC 2.81 (62%) FEV1 1.97 (58%) DLCO 19.33 (71%) No airway obstruction.  Moderate restrictive disease.  Diffusion capacity mildly reduced.  I personally reviewed the CT images and concur with the findings noted above PET/CT and pulmonary function reports also reviewed  Impression: Jacob Orozco is a 59 year old man with a history of tobacco abuse, COPD, obesity, sleep apnea, type 2 diabetes complicated by neuropathy, hypertension, and hyperlipidemia.  He was found to have a right lower lobe lung nodule back in July after presenting with shortness of breath and being treated for pneumonia.  CT showed a 3.7 x 2.1 cm cylindrical  "mass" in the central right lower lobe.  On PET CT there was minimal metabolic activity.  On follow-up chest CT there was essentially no change or possibly very slight enlargement.  I reviewed the CT images carefully it appears there is a 1.5 cm well-circumscribed lesion centered on her bronchus with likely mucus impaction behind that.  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  Hamartoma is possible but this seems more endobronchial than parenchymal.  Other benign tumors are possible but far less likely.  I discussed 3 potential options with Mr. Zahner.  He is accompanied by his ex-wife who is his best friend.  The first would be radiographic follow-up.  I do not think this is going to resolve.  There is no indication that this would turn aggressive in the short-term, but I do think he is at risk for repeated infections and ultimately is going to need resection.  Second option would be to do a navigational bronchoscopy to sample the lesion.  I think it would be relatively easy to get to the lesion but there is always the possibility of sampling error.  I do not think it would change the recommendations I think this nodule is going to need to come out one way or the other.  The final option would be surgical resection.  Obviously this is the most aggressive approach.  I think he will end up having a resection eventually, regardless of the initial approach.  I discussed the proposed operation with Mr. Inclan.  He understands the general nature of the procedure including the need for general anesthesia, the incisions to be used, the use of a drainage tube postoperatively, the expected hospital stay, and the overall recovery.  I informed him of the indications, risks, benefits, and alternatives.  He understands the risks include, but not limited to death, MI, DVT, PE, bleeding, possible need for transfusion, infection, prolonged air leak, cardiac  arrhythmias, as well as possibility of other unforeseeable complications.  We will try to preserve some of the lower lobe if we can, however think given the central location a lobectomy will probably be necessary.  Tobacco abuse-he says he quit smoking in August.  Importance of cessation was emphasized.  Plan: Robotic assisted right thoracoscopy for lower lobectomy.  My office will contact patient to schedule  I spent over 45 minutes in review of  records, review of images, and in consultation with Mr. Kittleson today. Melrose Nakayama, MD [facility] [phone]

## 2020-09-28 NOTE — Progress Notes (Signed)
[place], [place] [address] Phone: [phone] Fax: [phone]      Your procedure is scheduled on Monday 10/02/2020.  Report to [facility] Main Entrance "A" at 05:30 A.M., and check in at the Admitting office.  Call this number if you have problems the morning of surgery:  [phone]  Call [phone] if you have any questions prior to your surgery date Monday-Friday 8am-4pm    Remember:  Do not eat or drink after midnight the night before your surgery    Take these medicines the morning of surgery with A SIP OF WATER: Atorvastatin (Lipitor) Fluticasone-Umeclidin-Vilant (Trelegy Ellipta)  If Needed you may do an albuterol (Accumeb) nebulizer the morning of surgery.  As of today, STOP taking any Aspirin (unless otherwise instructed by your surgeon) Aleve, Naproxen, Ibuprofen, Motrin, Advil, Goody's, BC's, all herbal medications, fish oil, and all vitamins.   WHAT DO I DO ABOUT MY DIABETES MEDICATION?  Marland Kitchen Do not take oral diabetes medicines (Metformin- Glucophage-XR) the morning of surgery.   HOW TO MANAGE YOUR DIABETES BEFORE AND AFTER SURGERY  Why is it important to control my blood sugar before and after surgery? . Improving blood sugar levels before and after surgery helps healing and can limit problems. . A way of improving blood sugar control is eating a healthy diet by: o  Eating less sugar and carbohydrates o  Increasing activity/exercise o  Talking with your doctor about reaching your blood sugar goals . High blood sugars (greater than 180 mg/dL) can raise your risk of infections and slow your recovery, so you will need to focus on controlling your diabetes during the weeks before surgery. . Make sure that the doctor who takes care of your diabetes knows about your planned surgery including the date and location.  How do I manage my blood sugar before surgery? . Check your blood sugar at  least 4 times a day, starting 2 days before surgery, to make sure that the level is not too high or low. . Check your blood sugar the morning of your surgery when you wake up and every 2 hours until you get to the Short Stay unit. o If your blood sugar is less than 70 mg/dL, you will need to treat for low blood sugar: - Do not take insulin. - Treat a low blood sugar (less than 70 mg/dL) with  cup of clear juice (cranberry or apple), 4 glucose tablets, OR glucose gel. - Recheck blood sugar in 15 minutes after treatment (to make sure it is greater than 70 mg/dL). If your blood sugar is not greater than 70 mg/dL on recheck, call [phone] for further instructions. . Report your blood sugar to the short stay nurse when you get to Short Stay.  . If you are admitted to the hospital after surgery: o Your blood sugar will be checked by the staff and you will probably be given insulin after surgery (instead of oral diabetes medicines) to make sure you have good blood sugar levels. o The goal for blood sugar control after surgery is 80-180 mg/dL.                  Do not wear jewelry            Do not wear lotions, powders, colognes, or deodorant.            Do not shave 48 hours prior to surgery.  Men may  shave face and neck.            Do not bring valuables to the hospital.            [facility] is not responsible for any belongings or valuables.  Do NOT Smoke (Tobacco/Vaping) or drink Alcohol 24 hours prior to your procedure  If you use a CPAP at night, you may bring all equipment for your overnight stay.   Contacts, glasses, dentures or bridgework may not be worn into surgery.      For patients admitted to the hospital, discharge time will be determined by your treatment team.   Patients discharged the day of surgery will not be allowed to drive home, and someone needs to stay with them for 24 hours.    Special instructions:   [place]- Preparing For Surgery  Before surgery, you  can play an important role. Because skin is not sterile, your skin needs to be as free of germs as possible. You can reduce the number of germs on your skin by washing with CHG (chlorahexidine gluconate) Soap before surgery.  CHG is an antiseptic cleaner which kills germs and bonds with the skin to continue killing germs even after washing.    Oral Hygiene is also important to reduce your risk of infection.  Remember - BRUSH YOUR TEETH THE MORNING OF SURGERY WITH YOUR REGULAR TOOTHPASTE  Please do not use if you have an allergy to CHG or antibacterial soaps. If your skin becomes reddened/irritated stop using the CHG.  Do not shave (including legs and underarms) for at least 48 hours prior to first CHG shower. It is OK to shave your face.  Please follow these instructions carefully.   1. Shower the NIGHT BEFORE SURGERY and the MORNING OF SURGERY with CHG Soap.   2. If you chose to wash your hair, wash your hair first as usual with your normal shampoo.  3. After you shampoo, rinse your hair and body thoroughly to remove the shampoo.  4. Use CHG as you would any other liquid soap. You can apply CHG directly to the skin and wash gently with a scrungie or a clean washcloth.   5. Apply the CHG Soap to your body ONLY FROM THE NECK DOWN.  Do not use on open wounds or open sores. Avoid contact with your eyes, ears, mouth and genitals (private parts). Wash Face and genitals (private parts)  with your normal soap.   6. Wash thoroughly, paying special attention to the area where your surgery will be performed.  7. Thoroughly rinse your body with warm water from the neck down.  8. DO NOT shower/wash with your normal soap after using and rinsing off the CHG Soap.  9. Pat yourself dry with a CLEAN TOWEL.  10. Wear CLEAN PAJAMAS to bed the night before surgery  11. Place CLEAN SHEETS on your bed the night of your first shower and DO NOT SLEEP WITH PETS.   Day of Surgery: Shower with CHG soap as  directed Wear Clean/Comfortable clothing the morning of surgery Do not apply any deodorants/lotions.   Remember to brush your teeth WITH YOUR REGULAR TOOTHPASTE.   Please read over the following fact sheets that you were given.

## 2020-09-29 ENCOUNTER — Ambulatory Visit (HOSPITAL_COMMUNITY)
Admission: RE | Admit: 2020-09-29 | Discharge: 2020-09-29 | Disposition: A | Discharge status: Home / Self Care | Payer: BC Managed Care – PPO | Source: Ambulatory Visit | Attending: Thoracic Surgery (Cardiothoracic Vascular Surgery) | Admitting: Thoracic Surgery (Cardiothoracic Vascular Surgery)

## 2020-09-29 ENCOUNTER — Other Ambulatory Visit: Payer: Self-pay

## 2020-09-29 ENCOUNTER — Other Ambulatory Visit (HOSPITAL_COMMUNITY)
Admission: RE | Admit: 2020-09-29 | Discharge: 2020-09-29 | Disposition: A | Discharge status: Home / Self Care | Payer: BC Managed Care – PPO | Source: Ambulatory Visit | Attending: Thoracic Surgery (Cardiothoracic Vascular Surgery) | Admitting: Thoracic Surgery (Cardiothoracic Vascular Surgery)

## 2020-09-29 ENCOUNTER — Encounter (HOSPITAL_COMMUNITY)
Admission: RE | Admit: 2020-09-29 | Discharge: 2020-09-29 | Disposition: A | Discharge status: Home / Self Care | Payer: BC Managed Care – PPO | Source: Ambulatory Visit | Attending: Thoracic Surgery (Cardiothoracic Vascular Surgery) | Admitting: Thoracic Surgery (Cardiothoracic Vascular Surgery)

## 2020-09-29 ENCOUNTER — Encounter (HOSPITAL_COMMUNITY): Payer: Self-pay

## 2020-09-29 DIAGNOSIS — R911 Solitary pulmonary nodule: Secondary | ICD-10-CM | POA: Insufficient documentation

## 2020-09-29 DIAGNOSIS — Z01818 Encounter for other preprocedural examination: Secondary | ICD-10-CM | POA: Insufficient documentation

## 2020-09-29 DIAGNOSIS — Z20822 Contact with and (suspected) exposure to covid-19: Secondary | ICD-10-CM | POA: Insufficient documentation

## 2020-09-29 HISTORY — DX: Type 2 diabetes mellitus without complications: E11.9

## 2020-09-29 HISTORY — DX: Essential (primary) hypertension: I10

## 2020-09-29 HISTORY — DX: Sleep apnea, unspecified: G47.30

## 2020-09-29 LAB — URINALYSIS, ROUTINE W REFLEX MICROSCOPIC
Bacteria, UA: NONE SEEN
Bilirubin Urine: NEGATIVE
Glucose, UA: >=500 mg/dL — AB
Hgb urine dipstick: NEGATIVE
Ketones, ur: NEGATIVE mg/dL
Leukocytes,Ua: NEGATIVE
Nitrite: NEGATIVE
Protein, ur: 30 mg/dL — AB
Specific Gravity, Urine: 1.017 (ref 1.005–1.030)
pH: 5 (ref 5.0–8.0)

## 2020-09-29 LAB — BLOOD GAS, ARTERIAL
Acid-base deficit: 1.6 mmol/L (ref 0.0–2.0)
Bicarbonate: 22.4 mmol/L (ref 20.0–28.0)
FIO2: 21
O2 Saturation: 96.4 %
Patient temperature: 37
pCO2 arterial: 35.8 mmHg (ref 32.0–48.0)
pH, Arterial: 7.412 (ref 7.350–7.450)
pO2, Arterial: 83.2 mmHg (ref 83.0–108.0)

## 2020-09-29 LAB — COMPREHENSIVE METABOLIC PANEL
ALT: 23 U/L (ref 0–44)
AST: 21 U/L (ref 15–41)
Albumin: 3.4 g/dL — ABNORMAL LOW (ref 3.5–5.0)
Alkaline Phosphatase: 69 U/L (ref 38–126)
Anion gap: 14 (ref 5–15)
BUN: 17 mg/dL (ref 6–20)
CO2: 20 mmol/L — ABNORMAL LOW (ref 22–32)
Calcium: 9.1 mg/dL (ref 8.9–10.3)
Chloride: 103 mmol/L (ref 98–111)
Creatinine, Ser: 0.91 mg/dL (ref 0.61–1.24)
GFR, Estimated: >60 mL/min (ref >60)
Glucose, Bld: 258 mg/dL — ABNORMAL HIGH (ref 70–99)
Potassium: 3.9 mmol/L (ref 3.5–5.1)
Sodium: 137 mmol/L (ref 135–145)
Total Bilirubin: 0.7 mg/dL (ref 0.3–1.2)
Total Protein: 6.6 g/dL (ref 6.5–8.1)

## 2020-09-29 LAB — GLUCOSE, CAPILLARY: Glucose-Capillary: 245 mg/dL — ABNORMAL HIGH (ref 70–99)

## 2020-09-29 LAB — CBC
HCT: 41.8 % (ref 39.0–52.0)
Hemoglobin: 14 g/dL (ref 13.0–17.0)
MCH: 28.2 pg (ref 26.0–34.0)
MCHC: 33.5 g/dL (ref 30.0–36.0)
MCV: 84.3 fL (ref 80.0–100.0)
Platelets: 174 10*3/uL (ref 150–400)
RBC: 4.96 MIL/uL (ref 4.22–5.81)
RDW: 14.6 % (ref 11.5–15.5)
WBC: 9 10*3/uL (ref 4.0–10.5)
nRBC: 0 % (ref 0.0–0.2)

## 2020-09-29 LAB — SURGICAL PCR SCREEN
MRSA, PCR: NEGATIVE
Staphylococcus aureus: NEGATIVE

## 2020-09-29 LAB — SARS CORONAVIRUS 2 (TAT 6-24 HRS): SARS Coronavirus 2: NEGATIVE

## 2020-09-29 LAB — PROTIME-INR
INR: 1 (ref 0.8–1.2)
Prothrombin Time: 12.4 seconds (ref 11.4–15.2)

## 2020-09-29 LAB — APTT: aPTT: 58 seconds — ABNORMAL HIGH (ref 24–36)

## 2020-09-29 LAB — TYPE AND SCREEN
ABO/RH(D): A POS
Antibody Screen: NEGATIVE

## 2020-09-29 NOTE — Progress Notes (Addendum)
PCP:  Ledell Peoples, MD Cardiologist:  Genelle Bal in [place]  EKG:  09/29/20 CXR:  09/29/20 ECHO:  Denies Stress Test:  Denies Cardiac Cath:  Denies  Fasting Blood Sugar-  87-160  (245 in PAT) Checks Blood Sugar_1-6__ times a day  OSA/CPAP:  Yes, wears nightly  ASA/Blood Thinners:  No  Covid test 09/29/20  Anesthesia Review:  Yes, abnormal labs.  APTT 58, Glucose 245.  A1C pending.    Patient denies shortness of breath, fever, cough, and chest pain at PAT appointment.  Patient verbalized understanding of instructions provided today at the PAT appointment.  Patient asked to review instructions at home and day of surgery.

## 2020-10-01 NOTE — Anesthesia Preprocedure Evaluation (Addendum)
Anesthesia Evaluation  Patient identified by MRN, date of birth, ID band Patient awake    Reviewed: Allergy & Precautions, NPO status , Patient's Chart, lab work & pertinent test results  History of Anesthesia Complications Negative for: history of anesthetic complications  Airway Mallampati: III       Dental no notable dental hx. (+) Dental Advisory Given   Pulmonary sleep apnea , COPD, former smoker,    Pulmonary exam normal        Cardiovascular hypertension, Pt. on medications Normal cardiovascular exam     Neuro/Psych negative neurological ROS     GI/Hepatic negative GI ROS, Neg liver ROS,   Endo/Other  diabetes  Renal/GU negative Renal ROS     Musculoskeletal negative musculoskeletal ROS (+)   Abdominal   Peds  Hematology negative hematology ROS (+)   Anesthesia Other Findings   Reproductive/Obstetrics                           Anesthesia Physical Anesthesia Plan  ASA: III  Anesthesia Plan: General   Post-op Pain Management:    Induction: Intravenous  PONV Risk Score and Plan: 3 and Ondansetron and Dexamethasone  Airway Management Planned: Double Lumen EBT  Additional Equipment:   Intra-op Plan:   Post-operative Plan: Extubation in OR  Informed Consent: I have reviewed the patients History and Physical, chart, labs and discussed the procedure including the risks, benefits and alternatives for the proposed anesthesia with the patient or authorized representative who has indicated his/her understanding and acceptance.     Dental advisory given  Plan Discussed with: CRNA, Anesthesiologist and Surgeon  Anesthesia Plan Comments:        Anesthesia Quick Evaluation

## 2020-10-02 ENCOUNTER — Other Ambulatory Visit: Payer: Self-pay

## 2020-10-02 ENCOUNTER — Encounter (HOSPITAL_COMMUNITY): Payer: Self-pay | Admitting: Thoracic Surgery (Cardiothoracic Vascular Surgery)

## 2020-10-02 ENCOUNTER — Encounter (HOSPITAL_COMMUNITY)
Admission: RE | Disposition: A | Discharge status: Home / Self Care | Payer: Self-pay | Source: Home / Self Care | Attending: Thoracic Surgery (Cardiothoracic Vascular Surgery)

## 2020-10-02 ENCOUNTER — Inpatient Hospital Stay (HOSPITAL_COMMUNITY): Payer: BC Managed Care – PPO | Admitting: Anesthesiology

## 2020-10-02 ENCOUNTER — Inpatient Hospital Stay (HOSPITAL_COMMUNITY): Payer: BC Managed Care – PPO

## 2020-10-02 ENCOUNTER — Inpatient Hospital Stay (HOSPITAL_COMMUNITY)
Admission: RE | Admit: 2020-10-02 | Discharge: 2020-10-06 | DRG: 167 | Disposition: A | Discharge status: Home / Self Care | Payer: BC Managed Care – PPO | Attending: Thoracic Surgery (Cardiothoracic Vascular Surgery) | Admitting: Thoracic Surgery (Cardiothoracic Vascular Surgery)

## 2020-10-02 DIAGNOSIS — Z79899 Other long term (current) drug therapy: Secondary | ICD-10-CM | POA: Diagnosis not present

## 2020-10-02 DIAGNOSIS — F17210 Nicotine dependence, cigarettes, uncomplicated: Secondary | ICD-10-CM | POA: Diagnosis present

## 2020-10-02 DIAGNOSIS — Z885 Allergy status to narcotic agent status: Secondary | ICD-10-CM | POA: Diagnosis not present

## 2020-10-02 DIAGNOSIS — Z880 Allergy status to penicillin: Secondary | ICD-10-CM

## 2020-10-02 DIAGNOSIS — I10 Essential (primary) hypertension: Secondary | ICD-10-CM | POA: Diagnosis present

## 2020-10-02 DIAGNOSIS — Z4682 Encounter for fitting and adjustment of non-vascular catheter: Secondary | ICD-10-CM

## 2020-10-02 DIAGNOSIS — E1140 Type 2 diabetes mellitus with diabetic neuropathy, unspecified: Secondary | ICD-10-CM | POA: Diagnosis present

## 2020-10-02 DIAGNOSIS — Z20822 Contact with and (suspected) exposure to covid-19: Secondary | ICD-10-CM | POA: Diagnosis present

## 2020-10-02 DIAGNOSIS — Z9049 Acquired absence of other specified parts of digestive tract: Secondary | ICD-10-CM

## 2020-10-02 DIAGNOSIS — E785 Hyperlipidemia, unspecified: Secondary | ICD-10-CM | POA: Diagnosis present

## 2020-10-02 DIAGNOSIS — R911 Solitary pulmonary nodule: Secondary | ICD-10-CM | POA: Diagnosis present

## 2020-10-02 DIAGNOSIS — Z902 Acquired absence of lung [part of]: Secondary | ICD-10-CM

## 2020-10-02 DIAGNOSIS — J449 Chronic obstructive pulmonary disease, unspecified: Secondary | ICD-10-CM | POA: Diagnosis present

## 2020-10-02 DIAGNOSIS — D3A090 Benign carcinoid tumor of the bronchus and lung: Secondary | ICD-10-CM | POA: Diagnosis present

## 2020-10-02 DIAGNOSIS — E669 Obesity, unspecified: Secondary | ICD-10-CM | POA: Diagnosis present

## 2020-10-02 DIAGNOSIS — Z6841 Body Mass Index (BMI) 40.0 and over, adult: Secondary | ICD-10-CM

## 2020-10-02 DIAGNOSIS — Z7984 Long term (current) use of oral hypoglycemic drugs: Secondary | ICD-10-CM | POA: Diagnosis not present

## 2020-10-02 DIAGNOSIS — G4730 Sleep apnea, unspecified: Secondary | ICD-10-CM | POA: Diagnosis present

## 2020-10-02 DIAGNOSIS — J939 Pneumothorax, unspecified: Secondary | ICD-10-CM

## 2020-10-02 HISTORY — PX: LUNG SEGMENTECTOMY: SHX452

## 2020-10-02 HISTORY — PX: INTERCOSTAL NERVE BLOCK: SHX5021

## 2020-10-02 HISTORY — PX: OTHER SURGICAL HISTORY: SHX169

## 2020-10-02 LAB — GLUCOSE, CAPILLARY
Glucose-Capillary: 248 mg/dL — ABNORMAL HIGH (ref 70–99)
Glucose-Capillary: 258 mg/dL — ABNORMAL HIGH (ref 70–99)
Glucose-Capillary: 262 mg/dL — ABNORMAL HIGH (ref 70–99)
Glucose-Capillary: 283 mg/dL — ABNORMAL HIGH (ref 70–99)
Glucose-Capillary: 335 mg/dL — ABNORMAL HIGH (ref 70–99)

## 2020-10-02 LAB — ABO/RH: ABO/RH(D): A POS

## 2020-10-02 SURGERY — LOBECTOMY, LUNG, ROBOT-ASSISTED, USING VATS
Anesthesia: General | Site: Chest | Laterality: Right

## 2020-10-02 MED ORDER — INDOCYANINE GREEN 25 MG IV SOLR
INTRAVENOUS | Status: DC | PRN
  Administered 2020-10-02: 25 mg via INTRAVENOUS

## 2020-10-02 MED ORDER — ALBUTEROL SULFATE (2.5 MG/3ML) 0.083% IN NEBU
INHALATION_SOLUTION | RESPIRATORY_TRACT | Status: AC
  Filled 2020-10-02: qty 3

## 2020-10-02 MED ORDER — ROCURONIUM BROMIDE 10 MG/ML (PF) SYRINGE
PREFILLED_SYRINGE | INTRAVENOUS | Status: AC
  Filled 2020-10-02: qty 10

## 2020-10-02 MED ORDER — FENTANYL CITRATE (PF) 250 MCG/5ML IJ SOLN
INTRAMUSCULAR | Status: AC
  Filled 2020-10-02: qty 5

## 2020-10-02 MED ORDER — PHENYLEPHRINE 40 MCG/ML (10ML) SYRINGE FOR IV PUSH (FOR BLOOD PRESSURE SUPPORT)
PREFILLED_SYRINGE | INTRAVENOUS | Status: AC
  Filled 2020-10-02: qty 10

## 2020-10-02 MED ORDER — PROMETHAZINE HCL 25 MG/ML IJ SOLN: 6.2500 mg | INTRAMUSCULAR | Status: DC | PRN

## 2020-10-02 MED ORDER — HYDROXYZINE HCL 10 MG PO TABS
10.0000 mg | ORAL_TABLET | Freq: Every day | ORAL | Status: DC
  Administered 2020-10-02 – 2020-10-05 (×4): 10 mg via ORAL
  Filled 2020-10-02 (×4): qty 1

## 2020-10-02 MED ORDER — BUPIVACAINE LIPOSOME 1.3 % IJ SUSP
20.0000 mL | INTRAMUSCULAR | Status: DC
  Filled 2020-10-02: qty 20

## 2020-10-02 MED ORDER — GLYCOPYRROLATE PF 0.2 MG/ML IJ SOSY
PREFILLED_SYRINGE | INTRAMUSCULAR | Status: DC | PRN
  Administered 2020-10-02: .2 mg via INTRAVENOUS

## 2020-10-02 MED ORDER — IRBESARTAN 150 MG PO TABS
150.0000 mg | ORAL_TABLET | Freq: Every day | ORAL | Status: DC
  Administered 2020-10-03 – 2020-10-05 (×3): 150 mg via ORAL
  Filled 2020-10-02 (×3): qty 1

## 2020-10-02 MED ORDER — ONDANSETRON HCL 4 MG/2ML IJ SOLN
INTRAMUSCULAR | Status: DC | PRN
  Administered 2020-10-02: 4 mg via INTRAVENOUS

## 2020-10-02 MED ORDER — FLUTICASONE FUROATE-VILANTEROL 100-25 MCG/INH IN AEPB
1.0000 | INHALATION_SPRAY | Freq: Every day | RESPIRATORY_TRACT | Status: DC
  Administered 2020-10-03 – 2020-10-06 (×4): 1 via RESPIRATORY_TRACT
  Filled 2020-10-02: qty 28

## 2020-10-02 MED ORDER — DEXAMETHASONE SODIUM PHOSPHATE 10 MG/ML IJ SOLN
INTRAMUSCULAR | Status: DC | PRN
  Administered 2020-10-02: 10 mg via INTRAVENOUS

## 2020-10-02 MED ORDER — INSULIN ASPART 100 UNIT/ML ~~LOC~~ SOLN
SUBCUTANEOUS | Status: AC
  Filled 2020-10-02: qty 1

## 2020-10-02 MED ORDER — UMECLIDINIUM BROMIDE 62.5 MCG/INH IN AEPB
1.0000 | INHALATION_SPRAY | Freq: Every day | RESPIRATORY_TRACT | Status: DC
  Administered 2020-10-03 – 2020-10-06 (×4): 1 via RESPIRATORY_TRACT
  Filled 2020-10-02: qty 7

## 2020-10-02 MED ORDER — ALBUTEROL SULFATE 0.63 MG/3ML IN NEBU: 1.0000 | INHALATION_SOLUTION | Freq: Four times a day (QID) | RESPIRATORY_TRACT | Status: DC | PRN

## 2020-10-02 MED ORDER — ACETAMINOPHEN 160 MG/5ML PO SOLN: 1000.0000 mg | Freq: Four times a day (QID) | ORAL | Status: DC

## 2020-10-02 MED ORDER — INSULIN ASPART 100 UNIT/ML ~~LOC~~ SOLN
0.0000 [IU] | SUBCUTANEOUS | Status: DC
  Administered 2020-10-02 (×3): 12 [IU] via SUBCUTANEOUS
  Administered 2020-10-03: 4 [IU] via SUBCUTANEOUS
  Administered 2020-10-03 (×2): 16 [IU] via SUBCUTANEOUS
  Administered 2020-10-03 (×2): 8 [IU] via SUBCUTANEOUS
  Administered 2020-10-03: 16 [IU] via SUBCUTANEOUS
  Administered 2020-10-04: 2 [IU] via SUBCUTANEOUS
  Administered 2020-10-04: 4 [IU] via SUBCUTANEOUS

## 2020-10-02 MED ORDER — CHLORHEXIDINE GLUCONATE CLOTH 2 % EX PADS
6.0000 | MEDICATED_PAD | Freq: Every day | CUTANEOUS | Status: DC
  Administered 2020-10-02 – 2020-10-04 (×2): 6 via TOPICAL

## 2020-10-02 MED ORDER — SODIUM CHLORIDE FLUSH 0.9 % IV SOLN
INTRAVENOUS | Status: DC | PRN
  Administered 2020-10-02: 100 mL

## 2020-10-02 MED ORDER — SUCCINYLCHOLINE CHLORIDE 200 MG/10ML IV SOSY
PREFILLED_SYRINGE | INTRAVENOUS | Status: DC | PRN
  Administered 2020-10-02: 140 mg via INTRAVENOUS

## 2020-10-02 MED ORDER — ATORVASTATIN CALCIUM 10 MG PO TABS
10.0000 mg | ORAL_TABLET | Freq: Every day | ORAL | Status: DC
  Administered 2020-10-03 – 2020-10-05 (×3): 10 mg via ORAL
  Filled 2020-10-02 (×3): qty 1

## 2020-10-02 MED ORDER — ALBUTEROL SULFATE (2.5 MG/3ML) 0.083% IN NEBU
2.5000 mg | INHALATION_SOLUTION | Freq: Once | RESPIRATORY_TRACT | Status: AC
  Administered 2020-10-02: 2.5 mg via RESPIRATORY_TRACT

## 2020-10-02 MED ORDER — BUPIVACAINE HCL (PF) 0.5 % IJ SOLN
INTRAMUSCULAR | Status: AC
  Filled 2020-10-02: qty 30

## 2020-10-02 MED ORDER — DEXAMETHASONE SODIUM PHOSPHATE 10 MG/ML IJ SOLN
INTRAMUSCULAR | Status: AC
  Filled 2020-10-02: qty 1

## 2020-10-02 MED ORDER — BISACODYL 5 MG PO TBEC
10.0000 mg | DELAYED_RELEASE_TABLET | Freq: Every day | ORAL | Status: DC
  Administered 2020-10-03 – 2020-10-05 (×3): 10 mg via ORAL
  Filled 2020-10-02 (×3): qty 2

## 2020-10-02 MED ORDER — PHENYLEPHRINE HCL-NACL 10-0.9 MG/250ML-% IV SOLN
INTRAVENOUS | Status: DC | PRN
  Administered 2020-10-02: 25 ug/min via INTRAVENOUS

## 2020-10-02 MED ORDER — ACETAMINOPHEN 500 MG PO TABS
1000.0000 mg | ORAL_TABLET | Freq: Four times a day (QID) | ORAL | Status: DC
  Administered 2020-10-02 – 2020-10-06 (×13): 1000 mg via ORAL
  Filled 2020-10-02 (×14): qty 2

## 2020-10-02 MED ORDER — SODIUM CHLORIDE 0.9 % IV SOLN: INTRAVENOUS | Status: DC

## 2020-10-02 MED ORDER — AMLODIPINE BESYLATE 5 MG PO TABS
5.0000 mg | ORAL_TABLET | Freq: Every day | ORAL | Status: DC
  Administered 2020-10-03 – 2020-10-05 (×3): 5 mg via ORAL
  Filled 2020-10-02 (×3): qty 1

## 2020-10-02 MED ORDER — LIDOCAINE HCL (PF) 2 % IJ SOLN
INTRAMUSCULAR | Status: AC
  Filled 2020-10-02: qty 5

## 2020-10-02 MED ORDER — SUGAMMADEX SODIUM 200 MG/2ML IV SOLN
INTRAVENOUS | Status: DC | PRN
  Administered 2020-10-02: 300 mg via INTRAVENOUS

## 2020-10-02 MED ORDER — PROPOFOL 10 MG/ML IV BOLUS
INTRAVENOUS | Status: AC
  Filled 2020-10-02: qty 20

## 2020-10-02 MED ORDER — PROPOFOL 10 MG/ML IV BOLUS
INTRAVENOUS | Status: DC | PRN
  Administered 2020-10-02: 200 mg via INTRAVENOUS

## 2020-10-02 MED ORDER — OXYCODONE HCL 5 MG PO TABS
5.0000 mg | ORAL_TABLET | ORAL | Status: DC | PRN
  Administered 2020-10-02 – 2020-10-03 (×2): 5 mg via ORAL
  Administered 2020-10-03 (×2): 10 mg via ORAL
  Administered 2020-10-03: 5 mg via ORAL
  Administered 2020-10-03 – 2020-10-04 (×4): 10 mg via ORAL
  Administered 2020-10-05 (×2): 5 mg via ORAL
  Filled 2020-10-02: qty 2
  Filled 2020-10-02: qty 1
  Filled 2020-10-02 (×3): qty 2
  Filled 2020-10-02 (×2): qty 1
  Filled 2020-10-02 (×3): qty 2
  Filled 2020-10-02: qty 1

## 2020-10-02 MED ORDER — ONDANSETRON HCL 4 MG/2ML IJ SOLN: 4.0000 mg | Freq: Four times a day (QID) | INTRAMUSCULAR | Status: DC | PRN

## 2020-10-02 MED ORDER — CHLORHEXIDINE GLUCONATE 0.12 % MT SOLN
15.0000 mL | Freq: Once | OROMUCOSAL | Status: AC
  Administered 2020-10-02: 15 mL via OROMUCOSAL
  Filled 2020-10-02: qty 15

## 2020-10-02 MED ORDER — FENTANYL CITRATE (PF) 100 MCG/2ML IJ SOLN
INTRAMUSCULAR | Status: AC
  Filled 2020-10-02: qty 2

## 2020-10-02 MED ORDER — FENTANYL CITRATE (PF) 250 MCG/5ML IJ SOLN
INTRAMUSCULAR | Status: DC | PRN
  Administered 2020-10-02: 100 ug via INTRAVENOUS
  Administered 2020-10-02 (×2): 50 ug via INTRAVENOUS
  Administered 2020-10-02: 100 ug via INTRAVENOUS
  Administered 2020-10-02: 50 ug via INTRAVENOUS

## 2020-10-02 MED ORDER — SODIUM CHLORIDE 0.9 % IR SOLN
Status: DC | PRN
  Administered 2020-10-02: 1000 mL

## 2020-10-02 MED ORDER — ACETAMINOPHEN 500 MG PO TABS
1000.0000 mg | ORAL_TABLET | Freq: Once | ORAL | Status: AC
  Administered 2020-10-02: 1000 mg via ORAL
  Filled 2020-10-02: qty 2

## 2020-10-02 MED ORDER — ROCURONIUM BROMIDE 10 MG/ML (PF) SYRINGE
PREFILLED_SYRINGE | INTRAVENOUS | Status: DC | PRN
  Administered 2020-10-02: 90 mg via INTRAVENOUS
  Administered 2020-10-02: 40 mg via INTRAVENOUS
  Administered 2020-10-02: 20 mg via INTRAVENOUS

## 2020-10-02 MED ORDER — AMLODIPINE BESYLATE-VALSARTAN 5-160 MG PO TABS: 1.0000 | ORAL_TABLET | Freq: Every day | ORAL | Status: DC

## 2020-10-02 MED ORDER — LACTATED RINGERS IV SOLN: INTRAVENOUS | Status: DC

## 2020-10-02 MED ORDER — ENOXAPARIN SODIUM 40 MG/0.4ML ~~LOC~~ SOLN
40.0000 mg | SUBCUTANEOUS | Status: DC
  Administered 2020-10-02 – 2020-10-05 (×4): 40 mg via SUBCUTANEOUS
  Filled 2020-10-02 (×4): qty 0.4

## 2020-10-02 MED ORDER — METOCLOPRAMIDE HCL 5 MG/ML IJ SOLN
10.0000 mg | Freq: Four times a day (QID) | INTRAMUSCULAR | Status: AC
  Administered 2020-10-02 – 2020-10-03 (×3): 10 mg via INTRAVENOUS
  Filled 2020-10-02 (×3): qty 2

## 2020-10-02 MED ORDER — MIDAZOLAM HCL 5 MG/5ML IJ SOLN
INTRAMUSCULAR | Status: DC | PRN
  Administered 2020-10-02: 2 mg via INTRAVENOUS

## 2020-10-02 MED ORDER — MIDAZOLAM HCL 2 MG/2ML IJ SOLN
INTRAMUSCULAR | Status: AC
  Filled 2020-10-02: qty 2

## 2020-10-02 MED ORDER — ALBUMIN HUMAN 5 % IV SOLN: INTRAVENOUS | Status: DC | PRN

## 2020-10-02 MED ORDER — SENNOSIDES-DOCUSATE SODIUM 8.6-50 MG PO TABS
1.0000 | ORAL_TABLET | Freq: Every day | ORAL | Status: DC
  Administered 2020-10-02 – 2020-10-05 (×4): 1 via ORAL
  Filled 2020-10-02 (×4): qty 1

## 2020-10-02 MED ORDER — LACTATED RINGERS IV SOLN: INTRAVENOUS | Status: DC | PRN

## 2020-10-02 MED ORDER — VANCOMYCIN HCL IN DEXTROSE 1-5 GM/200ML-% IV SOLN
1000.0000 mg | Freq: Two times a day (BID) | INTRAVENOUS | Status: AC
  Administered 2020-10-02: 1000 mg via INTRAVENOUS
  Filled 2020-10-02: qty 200

## 2020-10-02 MED ORDER — VANCOMYCIN HCL 1500 MG/300ML IV SOLN
1500.0000 mg | INTRAVENOUS | Status: AC
  Administered 2020-10-02: 1500 mg via INTRAVENOUS
  Filled 2020-10-02 (×2): qty 300

## 2020-10-02 MED ORDER — ORAL CARE MOUTH RINSE: 15.0000 mL | Freq: Once | OROMUCOSAL | Status: AC

## 2020-10-02 MED ORDER — HEMOSTATIC AGENTS (NO CHARGE) OPTIME
TOPICAL | Status: DC | PRN
  Administered 2020-10-02 (×2): 1 via TOPICAL

## 2020-10-02 MED ORDER — KETOROLAC TROMETHAMINE 30 MG/ML IJ SOLN
INTRAMUSCULAR | Status: AC
  Filled 2020-10-02: qty 1

## 2020-10-02 MED ORDER — CELECOXIB 200 MG PO CAPS
200.0000 mg | ORAL_CAPSULE | Freq: Once | ORAL | Status: AC
  Administered 2020-10-02: 200 mg via ORAL
  Filled 2020-10-02: qty 1

## 2020-10-02 MED ORDER — FLUTICASONE-UMECLIDIN-VILANT 100-62.5-25 MCG/INH IN AEPB: 1.0000 | INHALATION_SPRAY | Freq: Every morning | RESPIRATORY_TRACT | Status: DC

## 2020-10-02 MED ORDER — SUGAMMADEX SODIUM 500 MG/5ML IV SOLN
INTRAVENOUS | Status: AC
  Filled 2020-10-02: qty 5

## 2020-10-02 MED ORDER — TRAMADOL HCL 50 MG PO TABS
50.0000 mg | ORAL_TABLET | Freq: Four times a day (QID) | ORAL | Status: DC | PRN
  Administered 2020-10-03 – 2020-10-04 (×2): 100 mg via ORAL
  Filled 2020-10-02 (×2): qty 2

## 2020-10-02 MED ORDER — 0.9 % SODIUM CHLORIDE (POUR BTL) OPTIME
TOPICAL | Status: DC | PRN
  Administered 2020-10-02: 2000 mL

## 2020-10-02 MED ORDER — LUNG SURGERY BOOK
Freq: Once | Status: AC
  Filled 2020-10-02: qty 1

## 2020-10-02 MED ORDER — FENTANYL CITRATE (PF) 100 MCG/2ML IJ SOLN
25.0000 ug | INTRAMUSCULAR | Status: DC | PRN
  Administered 2020-10-02: 50 ug via INTRAVENOUS

## 2020-10-02 MED ORDER — KETOROLAC TROMETHAMINE 30 MG/ML IJ SOLN
30.0000 mg | Freq: Four times a day (QID) | INTRAMUSCULAR | Status: DC
  Administered 2020-10-02 – 2020-10-03 (×4): 30 mg via INTRAVENOUS
  Filled 2020-10-02 (×3): qty 1

## 2020-10-02 MED ORDER — ONDANSETRON HCL 4 MG/2ML IJ SOLN
INTRAMUSCULAR | Status: AC
  Filled 2020-10-02: qty 2

## 2020-10-02 MED ORDER — ALBUTEROL SULFATE (2.5 MG/3ML) 0.083% IN NEBU
0.6300 mg | INHALATION_SOLUTION | Freq: Four times a day (QID) | RESPIRATORY_TRACT | Status: DC | PRN
  Administered 2020-10-04: 0.63 mg via RESPIRATORY_TRACT
  Filled 2020-10-02: qty 3

## 2020-10-02 MED ORDER — INDOCYANINE GREEN 25 MG IV SOLR
INTRAVENOUS | Status: AC
  Filled 2020-10-02: qty 10

## 2020-10-02 SURGICAL SUPPLY — 122 items
ADH SKN CLS APL DERMABOND .7 (GAUZE/BANDAGES/DRESSINGS) ×1
BAG TISS RTRVL C300 12X14 (MISCELLANEOUS) ×1
BLADE CLIPPER SURG (BLADE) IMPLANT
CANISTER SUCT 3000ML PPV (MISCELLANEOUS) ×6 IMPLANT
CANNULA REDUC XI 12-8 STAPL (CANNULA) ×2
CANNULA REDUC XI 12-8MM STAPL (CANNULA) ×2
CANNULA REDUCER 12-8 DVNC XI (CANNULA) ×2 IMPLANT
CATH THORACIC 28FR (CATHETERS) IMPLANT
CATH THORACIC 28FR RT ANG (CATHETERS) IMPLANT
CATH THORACIC 36FR (CATHETERS) IMPLANT
CATH THORACIC 36FR RT ANG (CATHETERS) IMPLANT
CLIP VESOCCLUDE MED 6/CT (CLIP) IMPLANT
CNTNR URN SCR LID CUP LEK RST (MISCELLANEOUS) ×6 IMPLANT
CONN ST 1/4X3/8  BEN (MISCELLANEOUS) ×2
CONN ST 1/4X3/8 BEN (MISCELLANEOUS) ×1 IMPLANT
CONN Y 3/8X3/8X3/8  BEN (MISCELLANEOUS)
CONN Y 3/8X3/8X3/8 BEN (MISCELLANEOUS) IMPLANT
CONT SPEC 4OZ STRL OR WHT (MISCELLANEOUS) ×18
DEFOGGER SCOPE WARMER CLEARIFY (MISCELLANEOUS) ×3 IMPLANT
DERMABOND ADVANCED (GAUZE/BANDAGES/DRESSINGS) ×2
DERMABOND ADVANCED .7 DNX12 (GAUZE/BANDAGES/DRESSINGS) ×1 IMPLANT
DRAIN CHANNEL 28F RND 3/8 FF (WOUND CARE) ×3 IMPLANT
DRAIN CHANNEL 32F RND 10.7 FF (WOUND CARE) IMPLANT
DRAPE ARM DVNC X/XI (DISPOSABLE) ×4 IMPLANT
DRAPE COLUMN DVNC XI (DISPOSABLE) ×1 IMPLANT
DRAPE CV SPLIT W-CLR ANES SCRN (DRAPES) ×3 IMPLANT
DRAPE DA VINCI XI ARM (DISPOSABLE) ×8
DRAPE DA VINCI XI COLUMN (DISPOSABLE) ×2
DRAPE INCISE IOBAN 66X45 STRL (DRAPES) IMPLANT
DRAPE ORTHO SPLIT 77X108 STRL (DRAPES) ×3
DRAPE SURG ORHT 6 SPLT 77X108 (DRAPES) ×1 IMPLANT
ELECT BLADE 6.5 EXT (BLADE) ×3 IMPLANT
ELECT REM PT RETURN 9FT ADLT (ELECTROSURGICAL) ×3
ELECTRODE REM PT RTRN 9FT ADLT (ELECTROSURGICAL) ×1 IMPLANT
GAUZE KITTNER 4X5 RF (MISCELLANEOUS) ×9 IMPLANT
GAUZE SPONGE 4X4 12PLY STRL (GAUZE/BANDAGES/DRESSINGS) ×3 IMPLANT
GLOVE BIO SURGEON STRL SZ 6.5 (GLOVE) ×4 IMPLANT
GLOVE BIO SURGEONS STRL SZ 6.5 (GLOVE) ×2
GLOVE BIOGEL PI IND STRL 6.5 (GLOVE) ×2 IMPLANT
GLOVE BIOGEL PI INDICATOR 6.5 (GLOVE) ×4
GLOVE ECLIPSE 8.0 STRL XLNG CF (GLOVE) ×6 IMPLANT
GLOVE SURG SS PI 8.0 STRL IVOR (GLOVE) ×6 IMPLANT
GLOVE SURG SYN 7.5  E (GLOVE) ×2
GLOVE SURG SYN 7.5 E (GLOVE) ×1 IMPLANT
GLOVE SURG UNDER POLY LF SZ7 (GLOVE) ×3 IMPLANT
GLOVE TRIUMPH SURG SIZE 7.5 (KITS) IMPLANT
GOWN STRL REUS W/ TWL LRG LVL3 (GOWN DISPOSABLE) ×2 IMPLANT
GOWN STRL REUS W/ TWL XL LVL3 (GOWN DISPOSABLE) ×4 IMPLANT
GOWN STRL REUS W/TWL 2XL LVL3 (GOWN DISPOSABLE) ×6 IMPLANT
GOWN STRL REUS W/TWL LRG LVL3 (GOWN DISPOSABLE) ×6
GOWN STRL REUS W/TWL XL LVL3 (GOWN DISPOSABLE) ×12
HEMOSTAT SURGICEL 2X14 (HEMOSTASIS) ×3 IMPLANT
IRRIGATION STRYKERFLOW (MISCELLANEOUS) ×1 IMPLANT
IRRIGATOR STRYKERFLOW (MISCELLANEOUS) ×3
KIT BASIN OR (CUSTOM PROCEDURE TRAY) ×3 IMPLANT
KIT SUCTION CATH 14FR (SUCTIONS) IMPLANT
KIT TURNOVER KIT B (KITS) ×3 IMPLANT
LOOP VESSEL SUPERMAXI WHITE (MISCELLANEOUS) IMPLANT
NEEDLE HYPO 25GX1X1/2 BEV (NEEDLE) ×3 IMPLANT
NEEDLE SPNL 18GX3.5 QUINCKE PK (NEEDLE) ×3 IMPLANT
NEEDLE SPNL 22GX3.5 QUINCKE BK (NEEDLE) ×3 IMPLANT
NS IRRIG 1000ML POUR BTL (IV SOLUTION) ×6 IMPLANT
PACK CHEST (CUSTOM PROCEDURE TRAY) ×3 IMPLANT
PAD ARMBOARD 7.5X6 YLW CONV (MISCELLANEOUS) ×6 IMPLANT
PORT ACCESS TROCAR AIRSEAL 12 (TROCAR) ×1 IMPLANT
PORT ACCESS TROCAR AIRSEAL 5M (TROCAR) ×2
PROGEL SPRAY TIP 11IN (MISCELLANEOUS) ×3
RELOAD STAPLER 2.5X45 WHT DVNC (STAPLE) ×2 IMPLANT
RELOAD STAPLER 3.5X45 BLU DVNC (STAPLE) ×3 IMPLANT
RELOAD STAPLER 4.3X45 GRN DVNC (STAPLE) ×6 IMPLANT
SCISSORS LAP 5X35 DISP (ENDOMECHANICALS) IMPLANT
SEAL CANN UNIV 5-8 DVNC XI (MISCELLANEOUS) ×2 IMPLANT
SEAL XI 5MM-8MM UNIVERSAL (MISCELLANEOUS) ×4
SEALANT PROGEL (MISCELLANEOUS) IMPLANT
SEALANT SURG COSEAL 4ML (VASCULAR PRODUCTS) IMPLANT
SEALANT SURG COSEAL 8ML (VASCULAR PRODUCTS) IMPLANT
SEALER SYNCHRO 8 IS4000 DV (MISCELLANEOUS) ×2
SEALER SYNCHRO 8 IS4000 DVNC (MISCELLANEOUS) ×1 IMPLANT
SET TRI-LUMEN FLTR TB AIRSEAL (TUBING) ×3 IMPLANT
SHEARS HARMONIC HDI 20CM (ELECTROSURGICAL) IMPLANT
SOLUTION ELECTROLUBE (MISCELLANEOUS) ×3 IMPLANT
SPONGE INTESTINAL PEANUT (DISPOSABLE) IMPLANT
SPONGE TONSIL TAPE 1 RFD (DISPOSABLE) IMPLANT
STAPLE RELOAD 45MM BLUE (STAPLE) IMPLANT
STAPLER 45 SUREFORM CVD (STAPLE) ×2
STAPLER 45 SUREFORM CVD DVNC (STAPLE) ×1 IMPLANT
STAPLER CANNULA SEAL DVNC XI (STAPLE) ×2 IMPLANT
STAPLER CANNULA SEAL XI (STAPLE) ×4
STAPLER RELOAD 2.5X45 WHITE (STAPLE) ×4
STAPLER RELOAD 2.5X45 WHT DVNC (STAPLE) ×2
STAPLER RELOAD 3.5X45 BLU DVNC (STAPLE) ×3
STAPLER RELOAD 3.5X45 BLUE (STAPLE) ×6
STAPLER RELOAD 4.3X45 GREEN (STAPLE) ×12
STAPLER RELOAD 4.3X45 GRN DVNC (STAPLE) ×6
SUT PDS AB 3-0 SH 27 (SUTURE) IMPLANT
SUT PROLENE 4 0 RB 1 (SUTURE)
SUT PROLENE 4-0 RB1 .5 CRCL 36 (SUTURE) IMPLANT
SUT SILK  1 MH (SUTURE) ×4
SUT SILK 1 MH (SUTURE) ×2 IMPLANT
SUT SILK 1 TIES 10X30 (SUTURE) ×3 IMPLANT
SUT SILK 2 0 SH (SUTURE) ×3 IMPLANT
SUT SILK 2 0SH CR/8 30 (SUTURE) IMPLANT
SUT SILK 3 0SH CR/8 30 (SUTURE) IMPLANT
SUT VIC AB 1 CTX 36 (SUTURE) ×3
SUT VIC AB 1 CTX36XBRD ANBCTR (SUTURE) ×1 IMPLANT
SUT VIC AB 2-0 CTX 36 (SUTURE) ×3 IMPLANT
SUT VIC AB 3-0 MH 27 (SUTURE) IMPLANT
SUT VIC AB 3-0 X1 27 (SUTURE) ×6 IMPLANT
SUT VICRYL 0 TIES 12 18 (SUTURE) ×3 IMPLANT
SUT VICRYL 0 UR6 27IN ABS (SUTURE) ×12 IMPLANT
SUT VICRYL 2 TP 1 (SUTURE) IMPLANT
SYR 20CC LL (SYRINGE) ×6 IMPLANT
SYR BULB IRRIG 60ML STRL (SYRINGE) ×3 IMPLANT
SYSTEM RETRIEVAL ANCHOR 12 (MISCELLANEOUS) ×3 IMPLANT
SYSTEM SAHARA CHEST DRAIN ATS (WOUND CARE) ×3 IMPLANT
TAPE CLOTH 4X10 WHT NS (GAUZE/BANDAGES/DRESSINGS) ×3 IMPLANT
TAPE CLOTH SURG 4X10 WHT LF (GAUZE/BANDAGES/DRESSINGS) ×3 IMPLANT
TIP APPLICATOR SPRAY EXTEND 16 (VASCULAR PRODUCTS) IMPLANT
TIP SPRAY PROGEL 11IN (MISCELLANEOUS) ×1 IMPLANT
TOWEL GREEN STERILE (TOWEL DISPOSABLE) ×3 IMPLANT
TRAY FOLEY MTR SLVR 16FR STAT (SET/KITS/TRAYS/PACK) ×3 IMPLANT
WATER STERILE IRR 1000ML POUR (IV SOLUTION) ×3 IMPLANT

## 2020-10-02 NOTE — Anesthesia Procedure Notes (Signed)
Central Venous Catheter Insertion Performed by: Duane Boston, MD, anesthesiologist Start/End12/03/2020 6:48 AM, 10/02/2020 7:02 AM Patient location: Pre-op. Preanesthetic checklist: patient identified, IV checked, site marked, risks and benefits discussed, surgical consent, monitors and equipment checked, pre-op evaluation, timeout performed and anesthesia consent Position: Trendelenburg Lidocaine 1% used for infiltration and patient sedated Hand hygiene performed , maximum sterile barriers used  and Seldinger technique used Catheter size: 8 Fr Total catheter length 16. Central line was placed.Double lumen Procedure performed using ultrasound guided technique. Ultrasound Notes:anatomy identified, needle tip was noted to be adjacent to the nerve/plexus identified, no ultrasound evidence of intravascular and/or intraneural injection and image(s) printed for medical record Attempts: 1 Following insertion, dressing applied, line sutured and Biopatch. Post procedure assessment: blood return through all ports, free fluid flow and no air  Patient tolerated the procedure well with no immediate complications.

## 2020-10-02 NOTE — Plan of Care (Signed)
  Problem: Pain Management: Goal: Pain level will decrease Outcome: Progressing   Problem: Education: Goal: Knowledge of General Education information will improve Description: Including pain rating scale, medication(s)/side effects and non-pharmacologic comfort measures Outcome: Progressing   Problem: Health Behavior/Discharge Planning: Goal: Ability to manage health-related needs will improve Outcome: Progressing   Problem: Clinical Measurements: Goal: Will remain free from infection Outcome: Progressing

## 2020-10-02 NOTE — Transfer of Care (Signed)
Immediate Anesthesia Transfer of Care Note  Patient: Jacob Orozco  Procedure(s) Performed: XI ROBOTIC ASSISTED THORASCOPY-RIGHT LOWER LOBE BASILAR SEGMENTECTOMY (Right Chest) INTERCOSTAL NERVE BLOCK (Right Chest)  Patient Location: PACU  Anesthesia Type:General  Level of Consciousness: awake, drowsy and patient cooperative  Airway & Oxygen Therapy: Patient Spontanous Breathing and Patient connected to face mask oxygen  Post-op Assessment: Report given to RN and Post -op Vital signs reviewed and stable  Post vital signs: Reviewed and stable  Last Vitals:  Vitals Value Taken Time  BP 134/57 10/02/20 1143  Temp    Pulse 85 10/02/20 1146  Resp 19 10/02/20 1146  SpO2 99 % 10/02/20 1146  Vitals shown include unvalidated device data.  Last Pain:  Vitals:   10/02/20 0619  PainSc: 0-No pain      Patients Stated Pain Goal: 3 (77/03/40 3524)  Complications: No complications documented.

## 2020-10-02 NOTE — Anesthesia Procedure Notes (Signed)
Procedure Name: Intubation Date/Time: 10/02/2020 7:40 AM Performed by: Renato Shin, CRNA Pre-anesthesia Checklist: Patient identified, Emergency Drugs available, Suction available and Patient being monitored Patient Re-evaluated:Patient Re-evaluated prior to induction Oxygen Delivery Method: Circle system utilized Preoxygenation: Pre-oxygenation with 100% oxygen Induction Type: IV induction Ventilation: Two handed mask ventilation required and Oral airway inserted - appropriate to patient size Laryngoscope Size: Glidescope and 4 Grade View: Grade II Tube type: Oral Endobronchial tube: Left, Double lumen EBT, EBT position confirmed by fiberoptic bronchoscope and EBT position confirmed by auscultation and 39 Fr Number of attempts: 3 Airway Equipment and Method: Stylet and Oral airway Placement Confirmation: ETT inserted through vocal cords under direct vision,  positive ETCO2 and breath sounds checked- equal and bilateral Tube secured with: Tape Dental Injury: Teeth and Oropharynx as per pre-operative assessment  Difficulty Due To: Difficulty was anticipated, Difficult Airway- due to anterior larynx, Difficult Airway-  due to edematous airway, Difficult Airway- due to dentition and Difficult Airway- due to large tongue Future Recommendations: Recommend- induction with short-acting agent, and alternative techniques readily available Comments: Grade 4 view with Mil2 by KTonkin CRNA and Dr Tobias Alexander. EZ mask with 2 hands and OA. DL x1 with glide 4. Grade 2 view. DLT able to be advanced on second attempt. Glottic opening anterior. VSS throughout.

## 2020-10-02 NOTE — Hospital Course (Addendum)
History of Present Illness:  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  The patient developed complaints of shortness of breath in July of this year.  He was diagnosed with pneumonia and treated with antibiotics.  Also during that workup the patient was found to have a right lower lobe lung nodule.  This was confirmed with CT scan and was 3.6 x 2.1 cm.  He underwent PET CT which showed low level of activity.  A repeat CT scan was performed 3 months later and there was little if any change in size.  Dr. Wynelle Cleveland evaluated the patient and felt he should undergo right lower lobectomy.  It was also recommended the patient undergo pulmonary rehab prior to proceeding with surgery.  He presented to Dr. Roxan Hockey for a second opinion.  The patient admits to experiencing shortness of breath with exertion.  He can walk up a flight of stairs but has to rest at the top.  The patient has been trying to lose weight and was able to lose about 10 lbs in the past 3 months.  The patient denied chest pain, pressure, or tightness.  He does have a lot of problems with neuropathy.  It was the felt the patient would require surgical resection.  He was offered radiographic follow up, bronchoscopy with biopsy, and finally surgical resection.  The patient ultimately decided to proceed with surgical resection.  The risks and benefits of the procedure were explained to the patient and he was agreeable to proceed.  Hospital course:  [name] presented to [facility].  He was taken to the operating room and underwent Robotic Assisted Thoracoscopy with Basilar segmentectomy of right lower lobe and intercostal nerve block.  He tolerated the procedure without difficulty, the patient was extubated and taken to the PACU in stable condition.  The patient resumed his nightly CPAP regimen postoperatively.  His chest tube exhibited an  air leak with cough. CXR showed a small apical space vs. Pneumothorax.  His chest tube remained on water seal with *** appearance of CXR.  His air leak resolved on 10/04/2020.  His chest tube output steadily decreased and was removed on 12/**/2021.  Follow up CXR showed ***.  The patient developed an elevation in his creatinine level.  This was felt to be due to Toradol usage, thus the medication was discontinued.  He is diabetic and his post operative sugars were elevated ranging from 200-300s.  Due to his elevated creatinine he was treated with Levemir and SSIP coverage.  Once creatinine improved he was restarted on his home Metformin prior to discharge.

## 2020-10-02 NOTE — Brief Op Note (Addendum)
10/02/2020  11:24 AM  PATIENT:  Jacob Orozco  59 y.o. male  PRE-OPERATIVE DIAGNOSIS:  RLL NODULE, CLINICAL STAGE IA(T1N0)  POST-OPERATIVE DIAGNOSIS:  RLL NEOPLASM, CLINICAL STAGE IA(T1N0)  PROCEDURE:  Procedure(s):  XI ROBOTIC ASSISTED THORASCOPY RIGHT LOWER LOBE BASILAR SEGMENTECTOMY (Right) INTERCOSTAL NERVE BLOCK (Right)  SURGEON:  Surgeon(s) and Role:    Melrose Nakayama, MD - Primary  PHYSICIAN ASSISTANT: Ellwood Handler PA-C  ANESTHESIA:   general  EBL:  50 mL   BLOOD ADMINISTERED:none  DRAINS: 28 Blake Drain Right Chest   LOCAL MEDICATIONS USED:  BUPIVICAINE   SPECIMEN:  Source of Specimen:  Right Lower Lobe Basilar Segments, Lymph Nodes  DISPOSITION OF SPECIMEN:  PATHOLOGY  COUNTS:  YES  TOURNIQUET:  * No tourniquets in log *  DICTATION: .Dragon Dictation  PLAN OF CARE: Admit to inpatient   PATIENT DISPOSITION:  PACU - hemodynamically stable.   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

## 2020-10-02 NOTE — Interval H&P Note (Signed)
History and Physical Interval Note:  10/02/2020 7:04 AM  Jacob Orozco  has presented today for surgery, with the diagnosis of RLL NODULE.  The various methods of treatment have been discussed with the patient and family. After consideration of risks, benefits and other options for treatment, the patient has consented to  Procedure(s): XI ROBOTIC ASSISTED THORASCOPY-RIGHT LOWER LOBECTOMY (Right) as a surgical intervention.  The patient's history has been reviewed, patient examined, no change in status, stable for surgery.  I have reviewed the patient's chart and labs.  Questions were answered to the patient's satisfaction.     Melrose Nakayama

## 2020-10-02 NOTE — Progress Notes (Signed)
Patient eating food that was brought from [employer] by daughter.Discussed with patient that blood sugars are elevated and to promote healing we need to maintain blood sugars readings between  70-120 range.Patient verbalized that he was still hungry after receiving dinner and his daughter brought him food and he was going to eat it.

## 2020-10-02 NOTE — Anesthesia Procedure Notes (Signed)
Arterial Line Insertion Start/End12/03/2020 6:50 AM, 10/02/2020 6:55 AM Performed by: CRNA  Patient location: Pre-op. Preanesthetic checklist: patient identified, IV checked, site marked, risks and benefits discussed, surgical consent, monitors and equipment checked, pre-op evaluation, timeout performed and anesthesia consent Lidocaine 1% used for infiltration Right, radial was placed Catheter size: 20 G Hand hygiene performed , maximum sterile barriers used  and Seldinger technique used Allen's test indicative of satisfactory collateral circulation Attempts: 1 Procedure performed without using ultrasound guided technique. Following insertion, dressing applied and Biopatch. Post procedure assessment: normal  Patient tolerated the procedure well with no immediate complications.

## 2020-10-02 NOTE — Anesthesia Postprocedure Evaluation (Signed)
Anesthesia Post Note  Patient: Jacob Orozco  Procedure(s) Performed: XI ROBOTIC ASSISTED THORASCOPY-RIGHT LOWER LOBE BASILAR SEGMENTECTOMY (Right Chest) INTERCOSTAL NERVE BLOCK (Right Chest)     Patient location during evaluation: PACU Anesthesia Type: General Level of consciousness: sedated Pain management: pain level controlled Vital Signs Assessment: post-procedure vital signs reviewed and stable Respiratory status: spontaneous breathing and respiratory function stable Cardiovascular status: stable Postop Assessment: no apparent nausea or vomiting Anesthetic complications: no   No complications documented.  Last Vitals:  Vitals:   10/02/20 1215 10/02/20 1230  BP: (!) 131/55 (!) 119/56  Pulse: 78 84  Resp: (!) 25 19  Temp:    SpO2: 98% 95%    Last Pain:  Vitals:   10/02/20 1215  PainSc: 8                  Chiyo Fay DANIEL

## 2020-10-03 ENCOUNTER — Inpatient Hospital Stay (HOSPITAL_COMMUNITY): Payer: BC Managed Care – PPO

## 2020-10-03 ENCOUNTER — Encounter (HOSPITAL_COMMUNITY): Payer: Self-pay | Admitting: Thoracic Surgery (Cardiothoracic Vascular Surgery)

## 2020-10-03 LAB — CBC
HCT: 35.8 % — ABNORMAL LOW (ref 39.0–52.0)
Hemoglobin: 12 g/dL — ABNORMAL LOW (ref 13.0–17.0)
MCH: 28.3 pg (ref 26.0–34.0)
MCHC: 33.5 g/dL (ref 30.0–36.0)
MCV: 84.4 fL (ref 80.0–100.0)
Platelets: 163 10*3/uL (ref 150–400)
RBC: 4.24 MIL/uL (ref 4.22–5.81)
RDW: 14.6 % (ref 11.5–15.5)
WBC: 15.4 10*3/uL — ABNORMAL HIGH (ref 4.0–10.5)
nRBC: 0 % (ref 0.0–0.2)

## 2020-10-03 LAB — GLUCOSE, CAPILLARY
Glucose-Capillary: 315 mg/dL — ABNORMAL HIGH (ref 70–99)
Glucose-Capillary: 317 mg/dL — ABNORMAL HIGH (ref 70–99)
Glucose-Capillary: 226 mg/dL — ABNORMAL HIGH (ref 70–99)
Glucose-Capillary: 198 mg/dL — ABNORMAL HIGH (ref 70–99)
Glucose-Capillary: 212 mg/dL — ABNORMAL HIGH (ref 70–99)
Glucose-Capillary: 163 mg/dL — ABNORMAL HIGH (ref 70–99)

## 2020-10-03 LAB — BASIC METABOLIC PANEL
Anion gap: 8 (ref 5–15)
BUN: 21 mg/dL — ABNORMAL HIGH (ref 6–20)
CO2: 23 mmol/L (ref 22–32)
Calcium: 8.4 mg/dL — ABNORMAL LOW (ref 8.9–10.3)
Chloride: 107 mmol/L (ref 98–111)
Creatinine, Ser: 1.17 mg/dL (ref 0.61–1.24)
GFR, Estimated: >60 mL/min (ref >60)
Glucose, Bld: 330 mg/dL — ABNORMAL HIGH (ref 70–99)
Potassium: 4.4 mmol/L (ref 3.5–5.1)
Sodium: 138 mmol/L (ref 135–145)

## 2020-10-03 MED ORDER — INSULIN DETEMIR 100 UNIT/ML ~~LOC~~ SOLN
20.0000 [IU] | Freq: Every day | SUBCUTANEOUS | Status: DC
  Administered 2020-10-03 – 2020-10-04 (×2): 20 [IU] via SUBCUTANEOUS
  Filled 2020-10-03 (×2): qty 0.2

## 2020-10-03 NOTE — Progress Notes (Signed)
Patient is requesting to keep foley catheter in until tomorrow morning. Will remove tomorrow.

## 2020-10-03 NOTE — Progress Notes (Signed)
Inpatient Diabetes Program Recommendations  AACE/ADA: New Consensus Statement on Inpatient Glycemic Control (2015)  Target Ranges:  Prepandial:   less than 140 mg/dL      Peak postprandial:   less than 180 mg/dL (1-2 hours)      Critically ill patients:  140 - 180 mg/dL   Lab Results  Component Value Date   GLUCAP 226 (H) 10/03/2020    Review of Glycemic Control Results for Jacob Orozco, Jacob Orozco (MRN [id number]) as of 10/03/2020 13:06  Ref. Range 10/02/2020 19:27 10/02/2020 23:54 10/03/2020 04:23 10/03/2020 08:33 10/03/2020 11:28  Glucose-Capillary Latest Ref Range: 70 - 99 mg/dL 283 (H) 335 (H) 315 (H) 317 (H) 226 (H)   Diabetes history: DM2 Outpatient Diabetes medications: Metformin 500 mg bid Current orders for Inpatient glycemic control: Levemir 20 units added today + Novolog correction 0-24 q 4 hrs.  Inpatient Diabetes Program Recommendations:   Consider A1c to determine prehospital glycemic control -Novolog 4 units tid meal coverage if eats 50%  Thank you, Jacob Orozco. Jacob Biswell, RN, MSN, CDE  Diabetes Coordinator Inpatient Glycemic Control Team Team Pager [phone] (8am-5pm) 10/03/2020 1:10 PM

## 2020-10-03 NOTE — Plan of Care (Signed)

## 2020-10-03 NOTE — Discharge Instructions (Signed)
Video-Assisted Thoracic Surgery, Care After This sheet gives you information about how to care for yourself after your procedure. Your health care provider may also give you more specific instructions. If you have problems or questions, contact your health care provider. What can I expect after the procedure? After the procedure, it is common to have:  Some pain and soreness in your chest.  Pain when breathing in (inhaling) and coughing.  Constipation.  Fatigue.  Difficulty sleeping. Follow these instructions at home: Preventing pneumonia  Take deep breaths or do breathing exercises as instructed by your health care provider. Doing this helps prevent lung infection (pneumonia).  Cough frequently. Coughing may cause discomfort, but it is important to clear mucus (phlegm) and expand your lungs. If it hurts to cough, hold a pillow against your chest or place the palms of both hands on top of the incision (use splinting) when you cough. This may help relieve discomfort.  If you were given an incentive spirometer, use it as directed. An incentive spirometer is a tool that measures how well you are filling your lungs with each breath.  Participate in pulmonary rehabilitation as directed by your health care provider. This is a program that combines education, exercise, and support from a team of specialists. The goal is to help you heal and get back to your normal activities as soon as possible. Medicines  Take over-the-counter or prescription medicines only as told by your health care provider.  If you have pain, take pain-relieving medicine before your pain becomes severe. This is important because if your pain is under control, you will be able to breathe and cough more comfortably.  If you were prescribed an antibiotic medicine, take it as told by your health care provider. Do not stop taking the antibiotic even if you start to feel better. Activity  Ask your health care provider what  activities are safe for you.  Avoid activities that use your chest muscles for at least 3-4 weeks.  Do not lift anything that is heavier than 10 lb (4.5 kg), or the limit that your health care provider tells you, until he or she says that it is safe. Incision care  Follow instructions from your health care provider about how to take care of your incision(s). Make sure you: ? Wash your hands with soap and water before you change your bandage (dressing). If soap and water are not available, use hand sanitizer. ? Change your dressing as told by your health care provider. ? Leave stitches (sutures), skin glue, or adhesive strips in place. These skin closures may need to stay in place for 2 weeks or longer. If adhesive strip edges start to loosen and curl up, you may trim the loose edges. Do not remove adhesive strips completely unless your health care provider tells you to do that.  Keep your dressing dry until it has been removed.  Check your incision area every day for signs of infection. Check for: ? Redness, swelling, or pain. ? Fluid or blood. ? Warmth. ? Pus or a bad smell. Bathing  Do not take baths, swim, or use a hot tub until your health care provider approves. You may take showers.  After your dressing has been removed, use soap and water to gently wash your incision area. Do not use anything else to clean your incision(s) unless your health care provider tells you to do this. Driving   Do not drive until your health care provider approves.  Do not drive or  use heavy machinery while taking prescription pain medicine. Eating and drinking  Eat a healthy, balanced diet as instructed by your health care provider. A healthy diet includes plenty of fresh fruits and vegetables, whole grains, and low-fat (lean) proteins.  Limit foods that are high in fat and processed sugars, such as fried and sweet foods.  Drink enough fluid to keep your urine clear or pale yellow. General  instructions   To prevent or treat constipation while you are taking prescription pain medicine, your health care provider may recommend that you: ? Take over-the-counter or prescription medicines. ? Eat foods that are high in fiber, such as beans, fresh fruits and vegetables, and whole grains.  Do not use any products that contain nicotine or tobacco, such as cigarettes and e-cigarettes. If you need help quitting, ask your health care provider.  Avoid secondhand smoke.  Wear compression stockings as told by your health care provider. These stockings help to prevent blood clots and reduce swelling in your legs.  If you have a chest tube, care for it as instructed by your health care provider. Do not travel by airplane during the 2 weeks after your chest tube is removed, or until your health care provider says that this is safe.  Keep all follow-up visits as told by your health care provider. This is important. Contact a health care provider if:  You have redness, swelling, or pain around an incision.  You have fluid or blood coming from an incision.  Your incision area feels warm to the touch.  You have pus or a bad smell coming from an incision.  You have a fever or chills.  You have nausea or vomiting.  You have pain that does not get better with medicine. Get help right away if:  You have chest pain.  Your heart is fluttering or beating rapidly.  You develop a rash.  You have shortness of breath or trouble breathing.  You are confused.  You have trouble speaking.  You feel weak, light-headed, or dizzy.  You faint. Summary  To help prevent lung infection (pneumonia), take deep breaths or do breathing exercises as instructed by your health care provider.  Cough frequently to clear mucus (phlegm) and expand your lungs. If it hurts to cough, hold a pillow against your chest or place the palms of both hands on top of the incision (use splinting) when you cough.  If  you have pain, take pain-relieving medicine before your pain becomes severe. This is important because if your pain is under control, you will be able to breathe and cough more comfortably.  Ask your health care provider what activities are safe for you. This information is not intended to replace advice given to you by your health care provider. Make sure you discuss any questions you have with your health care provider. Document Revised: 09/26/2017 Document Reviewed: 09/23/2016 Elsevier Patient Education  2020 [employer].

## 2020-10-03 NOTE — Progress Notes (Addendum)
      [address]       [place] [address]             [phone]      1 Day Post-Op Procedure(s) (LRB): XI ROBOTIC ASSISTED THORASCOPY-RIGHT LOWER LOBE BASILAR SEGMENTECTOMY (Right) INTERCOSTAL NERVE BLOCK (Right)   Subjective:  On CPAP machine.  Doing okay wants to get some more sleep.  Pain is well controlled, denies N/V  Objective: Vital signs in last 24 hours: Temp:  [97.7 F (36.5 C)-98.6 F (37 C)] 98.6 F (37 C) (12/07 0400) Pulse Rate:  [63-91] 67 (12/07 0400) Cardiac Rhythm: Normal sinus rhythm (12/06 1900) Resp:  [18-27] 19 (12/07 0400) BP: (102-137)/(49-73) 116/58 (12/07 0400) SpO2:  [92 %-99 %] 94 % (12/07 0400) Arterial Line BP: (155-166)/(57-87) 157/87 (12/06 1230)  Intake/Output from previous day: 12/06 0701 - 12/07 0700 In: 3714.1 [P.O.:780; I.V.:2384.1; IV Piggyback:550] Out: 2220 [Urine:1900; Blood:50; Chest Tube:270]  General appearance: alert, cooperative and no distress Heart: regular rate and rhythm Lungs: diminished breath sounds bibasilar Abdomen: soft, non-tender; bowel sounds normal; no masses,  no organomegaly Extremities: extremities normal, atraumatic, no cyanosis or edema Wound: clean and dry  Lab Results: Recent Labs    10/03/20 0410  WBC 15.4*  HGB 12.0*  HCT 35.8*  PLT 163   BMET:  Recent Labs    10/03/20 0410  NA 138  K 4.4  CL 107  CO2 23  GLUCOSE 330*  BUN 21*  CREATININE 1.17  CALCIUM 8.4*    PT/INR: No results for input(s): LABPROT, INR in the last 72 hours. ABG    Component Value Date/Time   PHART 7.412 09/29/2020 1523   HCO3 22.4 09/29/2020 1523   ACIDBASEDEF 1.6 09/29/2020 1523   O2SAT 96.4 09/29/2020 1523   CBG (last 3)  Recent Labs    10/02/20 1927 10/02/20 2354 10/03/20 0423  GLUCAP 283* 335* 315*    Assessment/Plan: S/P Procedure(s) (LRB): XI ROBOTIC ASSISTED THORASCOPY-RIGHT LOWER LOBE BASILAR SEGMENTECTOMY (Right) INTERCOSTAL NERVE BLOCK (Right)  1. CV- NSR, H/O  HTN- home Norvasc, Valsartan ordered 2. Pulm- + air leak with cough on water seal, CXR with bilateral atelectasis, right sided apical pneumothorax vs. Space.Marland Kitchen leave chest tube in place on water seal today 3. Renal- creatinine up to 1.17- suspect related due to Toradol use, will stop, hold off on restarting Metformin, repeat BMET in AM 4. DM-sugars are consistently elevated since surgery, will continue SSIP, add Levemir 20 units daily for now.. will restart home Metformin if creatinine remains stable 5. D/C IV Fluids 6. D/C Foley 7. Lovenox for DVT prophylaxis   LOS: 1 day    Ellwood Handler, PA-C 10/03/2020 Patient seen and examined, agree with above Ambulate   Remo Lipps C. Roxan Hockey, MD [facility] [phone]

## 2020-10-03 NOTE — Progress Notes (Signed)
RT placed patient on CPAP with 4L O2 bled into circuit. Patient tolerating settings well at this time. RT will monitor as needed. 

## 2020-10-03 NOTE — Op Note (Signed)
NAME: Jacob Orozco, Jacob Orozco MEDICAL RECORD [id number] ACCOUNT [id number] DATE OF BIRTH:1961-06-08 FACILITY: [facility] LOCATION: [facility] PHYSICIAN:Onda Kattner Chaya Jan, MD  OPERATIVE REPORT  DATE OF PROCEDURE:  10/02/2020  PREOPERATIVE DIAGNOSIS:  Right lower lobe lung nodule.  POSTOPERATIVE DIAGNOSIS:  Right lower lobe lung nodule.  PROCEDURE:   Xi robotic right video-assisted thoracoscopy, Right lower lobe basilar segmentectomy.   Lymph node sampling Intercostal nerve blocks at levels 3 through 10.  SURGEON:  Modesto Charon, MD  ASSISTANT:  Ellwood Handler, PA  ANESTHESIA:  General.  FINDINGS:  Nodules in posterior basilar portion of the right lower lobe.  Frozen section revealed a low-grade tumor, possible carcinoid and second nodule likely abscess cavity.  Bronchial margin negative for tumor.  CLINICAL NOTE:  Jacob Orozco is a 59 year old man with a history of tobacco abuse who presented back in July with shortness of breath and was diagnosed with pneumonia.  He was found to have a right lower lobe lung nodule.  At one point, this was felt to  be a 3.6 x 2.1 cm right lower lobe nodule, but actually appeared that there were 2 separate lesions, one was likely an intrabronchial nodule and the other was likely related to postobstructive findings.  He was advised to undergo surgical  resection with possible right lower lobectomy or segmentectomy based on intraoperative findings.  The indications, risks, benefits, and alternatives were discussed in detail with the patient.  He understood and accepted the risks and agreed to proceed.  OPERATIVE NOTE:  Jacob Orozco was brought to the preoperative holding area on 10/02/2020.  Anesthesia placed an arterial blood pressure monitoring line and a central venous catheter.  He was taken to the operating room, anesthetized and intubated with a  double lumen endotracheal tube.  Intravenous antibiotics were administered.  A Foley catheter was placed.   Sequential compression devices were placed on the calves for DVT prophylaxis.  He was placed in a left lateral decubitus position.  Active warming  was in place.  The right chest was prepped and draped in the usual sterile fashion.  Single lung ventilation of the left lung was initiated.  Initially, the patient had some low oxygen saturations associated with that.  Repositioning the tube had minimal  effect, but placement of a PEEP valve on the left side resulted in marked improvement of his oxygen saturations.  The right chest was prepped and draped in the usual sterile fashion.  A timeout was performed.  A solution containing 20 mL of liposomal bupivacaine, 30 mL of 0.5% bupivacaine and 50 mL of saline was prepared.  This was used for local at the incision sites as well as for the intercostal nerve blocks.  An incision was made  in the eighth interspace in the mid axillary line.  An 8 mm port was inserted.  The thoracoscope was advanced into the chest.  There was good isolation of the right lung.  Carbon dioxide was insufflated per protocol.  A 12 mm port was placed in the 8th  interspace more anteriorly and a 12 mm port was placed 5 cm posterior to the original port as well.  Intercostal nerve blocks were performed. A needle was inserted from a posterior approach and 10 mL of the bupivacaine solution was injected into a  subpleural plane at each level from the 3rd to the 10th interspace.  An additional 8 mm port was placed posteriorly for the retraction arm and an AirSeal port was placed in the 10th interspace posteriorly for the  assistant.  The robot was brought to the  operative field and deployed.  The camera arm was docked.  The camera was inserted.  Targeting was performed.  The remaining arms were docked.  The instruments were inserted.  The inferior pulmonary ligament was divided with bipolar cautery.  Lymph nodes that were encountered during the dissection were removed, but given the low-grade  nature of the tumor, a full lymph node dissection was not performed.  Level 9 node was  removed.  The pleural reflection then was divided at the hilum posteriorly up to the level of the takeoff of the right upper lobe bronchus and then anteriorly as well.  The dissection was carried to the fissure, but the fissure was incomplete in its  midportion.  Moving more anteriorly, the fissure was relatively complete anteriorly and the basilar segmental branch of the pulmonary artery was identified.  A plane then was developed and the superior segmental branch was identified as well.  The  fissure was completed with sequential firings of the robotic stapler.  The bifurcation of the basilar segmental pulmonary artery trunk and the superior segmental branch was dissected and once the basilar segmental branches had been freed up, they were  encircled.  The vessel loop was passed.  The robotic stapler then was used to divide the vessel.  A white cartridge was used on the stapler.  The inferior pulmonary vein was inspected.  The superior segmental branches were identified.  The basilar  segmental trunk was encircled.  Again, a vessel loop was used to help with passage of the stapler and the basilar segmental portion of the vein was divided with the vascular stapler.  The lower lobe bronchus was identified.  The superior segmental  bronchus was identified and preserved.  The basilar segmental trunk was encircled and divided with the robotic stapler.  A green cartridge was used on the stapler. It was placed across the segmental trunk and closed.  A test inflation showed good aeration of  both the middle lobe and the superior segment of the lower lobe in addition to the upper lobe.  The stapler was fired transecting the bronchus.  Once the bronchus had been transected, intravenous ICG was administered and helped demarcate, although there was  not a terribly clear demarcation, the perfused superior segment versus the basilar  segments.  The segmentectomy was completed with sequential firings of the robotic stapler using both blue and green cartridges.  The sponges that had been inserted and  the vessel loop were then removed.  The basilar segments were placed into an endoscopic retrieval bag, which was brought down to the inferior aspect of the chest.  The robot then was undocked.  The anterior 8th interspace incision was enlarged to about 3  cm in length and the specimen was removed in the bag through the interspace.  It was sent for frozen section of the nodules and the bronchial margin.  While awaiting those results, the chest was copiously irrigated with warm saline.  Hemostasis was  achieved.  A test inflation showed some air leakage from the area of the fissure that had been dissected prior to stapling.  Progel was applied to that area.  A 28-French Blake drain was placed through the original port incision and directed to the apex.   It was secured to skin with a #1 silk suture.  By this point, the frozen section returned.  The primary nodule was a possible carcinoid tumor.  The secondary nodule was an  area of cystic change, likely chronically infected.  The bronchial margin was  negative for tumor.  Dual-lung ventilation was resumed.  The remaining incisions were closed with 0 Vicryl fascial sutures and 3-0 Vicryl subcuticular sutures.  Dermabond was applied.  The chest tube was placed to a Pleur-evac on waterseal.  The patient  was placed back in supine position.  He was extubated in the operating room and taken to the [facility] in good condition.  IN/NUANCE  D:10/02/2020 T:10/03/2020 JOB:[id number]

## 2020-10-04 ENCOUNTER — Inpatient Hospital Stay (HOSPITAL_COMMUNITY): Payer: BC Managed Care – PPO

## 2020-10-04 LAB — COMPREHENSIVE METABOLIC PANEL
ALT: 44 U/L (ref 0–44)
AST: 24 U/L (ref 15–41)
Albumin: 3.1 g/dL — ABNORMAL LOW (ref 3.5–5.0)
Alkaline Phosphatase: 58 U/L (ref 38–126)
Anion gap: 9 (ref 5–15)
BUN: 23 mg/dL — ABNORMAL HIGH (ref 6–20)
CO2: 21 mmol/L — ABNORMAL LOW (ref 22–32)
Calcium: 8.3 mg/dL — ABNORMAL LOW (ref 8.9–10.3)
Chloride: 108 mmol/L (ref 98–111)
Creatinine, Ser: 1 mg/dL (ref 0.61–1.24)
GFR, Estimated: >60 mL/min (ref >60)
Glucose, Bld: 171 mg/dL — ABNORMAL HIGH (ref 70–99)
Potassium: 4.2 mmol/L (ref 3.5–5.1)
Sodium: 138 mmol/L (ref 135–145)
Total Bilirubin: 0.7 mg/dL (ref 0.3–1.2)
Total Protein: 6.3 g/dL — ABNORMAL LOW (ref 6.5–8.1)

## 2020-10-04 LAB — GLUCOSE, CAPILLARY
Glucose-Capillary: 150 mg/dL — ABNORMAL HIGH (ref 70–99)
Glucose-Capillary: 177 mg/dL — ABNORMAL HIGH (ref 70–99)
Glucose-Capillary: 175 mg/dL — ABNORMAL HIGH (ref 70–99)
Glucose-Capillary: 193 mg/dL — ABNORMAL HIGH (ref 70–99)
Glucose-Capillary: 159 mg/dL — ABNORMAL HIGH (ref 70–99)

## 2020-10-04 LAB — CBC
HCT: 39 % (ref 39.0–52.0)
Hemoglobin: 12.8 g/dL — ABNORMAL LOW (ref 13.0–17.0)
MCH: 27.9 pg (ref 26.0–34.0)
MCHC: 32.8 g/dL (ref 30.0–36.0)
MCV: 85.2 fL (ref 80.0–100.0)
Platelets: 174 10*3/uL (ref 150–400)
RBC: 4.58 MIL/uL (ref 4.22–5.81)
RDW: 15 % (ref 11.5–15.5)
WBC: 15.4 10*3/uL — ABNORMAL HIGH (ref 4.0–10.5)
nRBC: 0 % (ref 0.0–0.2)

## 2020-10-04 MED ORDER — METOCLOPRAMIDE HCL 5 MG/ML IJ SOLN
10.0000 mg | Freq: Four times a day (QID) | INTRAMUSCULAR | Status: AC
  Administered 2020-10-04 – 2020-10-05 (×3): 10 mg via INTRAVENOUS
  Filled 2020-10-04 (×3): qty 2

## 2020-10-04 MED ORDER — KETOROLAC TROMETHAMINE 15 MG/ML IJ SOLN
15.0000 mg | Freq: Four times a day (QID) | INTRAMUSCULAR | Status: DC | PRN
  Administered 2020-10-04 – 2020-10-05 (×5): 15 mg via INTRAVENOUS
  Filled 2020-10-04 (×5): qty 1

## 2020-10-04 MED ORDER — POLYETHYLENE GLYCOL 3350 17 G PO PACK
17.0000 g | PACK | Freq: Every day | ORAL | Status: DC | PRN
  Administered 2020-10-04 – 2020-10-05 (×2): 17 g via ORAL
  Filled 2020-10-04: qty 1

## 2020-10-04 MED ORDER — INSULIN ASPART 100 UNIT/ML ~~LOC~~ SOLN: 0.0000 [IU] | Freq: Three times a day (TID) | SUBCUTANEOUS | Status: DC

## 2020-10-04 MED ORDER — INSULIN DETEMIR 100 UNIT/ML ~~LOC~~ SOLN
25.0000 [IU] | Freq: Every day | SUBCUTANEOUS | Status: DC
  Administered 2020-10-05: 25 [IU] via SUBCUTANEOUS
  Filled 2020-10-04 (×2): qty 0.25

## 2020-10-04 MED ORDER — METFORMIN HCL ER 500 MG PO TB24
500.0000 mg | ORAL_TABLET | Freq: Two times a day (BID) | ORAL | Status: DC
  Administered 2020-10-04 – 2020-10-05 (×3): 500 mg via ORAL
  Filled 2020-10-04 (×4): qty 1

## 2020-10-04 MED ORDER — INSULIN ASPART 100 UNIT/ML ~~LOC~~ SOLN
0.0000 [IU] | Freq: Three times a day (TID) | SUBCUTANEOUS | Status: DC
  Administered 2020-10-04 (×3): 4 [IU] via SUBCUTANEOUS
  Administered 2020-10-05: 2 [IU] via SUBCUTANEOUS
  Administered 2020-10-05 – 2020-10-06 (×3): 4 [IU] via SUBCUTANEOUS

## 2020-10-04 NOTE — Plan of Care (Signed)

## 2020-10-04 NOTE — Progress Notes (Addendum)
      [address]       [place] [address]             [phone]      2 Days Post-Op Procedure(s) (LRB): XI ROBOTIC ASSISTED THORASCOPY-RIGHT LOWER LOBE BASILAR SEGMENTECTOMY (Right) INTERCOSTAL NERVE BLOCK (Right)   Subjective:  Jacob Orozco has no new complaints.  He states he wasn't walked yesterday, nor did he get up to the chair. He states the nurses had to change his dressing this morning as it was wet.  Objective: Vital signs in last 24 hours: Temp:  [97.7 F (36.5 C)-98.5 F (36.9 C)] 97.7 F (36.5 C) (12/08 0328) Pulse Rate:  [54-83] 76 (12/08 0328) Cardiac Rhythm: Normal sinus rhythm (12/08 0704) Resp:  [15-22] 18 (12/08 0328) BP: (120-147)/(55-94) 140/72 (12/08 0400) SpO2:  [90 %-97 %] 96 % (12/08 0400) Weight:  [128.9 kg] 128.9 kg (12/08 0506)  Intake/Output from previous day: 12/07 0701 - 12/08 0700 In: 990 [P.O.:990] Out: 1915 [Urine:1375; Chest Tube:540]  General appearance: alert, cooperative and no distress Heart: regular rate and rhythm Lungs: clear to auscultation bilaterally Abdomen: soft, non-tender; bowel sounds normal; no masses,  no organomegaly Wound: clean and dry  Lab Results: Recent Labs    10/03/20 0410 10/04/20 0046  WBC 15.4* 15.4*  HGB 12.0* 12.8*  HCT 35.8* 39.0  PLT 163 174   BMET:  Recent Labs    10/03/20 0410 10/04/20 0046  NA 138 138  K 4.4 4.2  CL 107 108  CO2 23 21*  GLUCOSE 330* 171*  BUN 21* 23*  CREATININE 1.17 1.00  CALCIUM 8.4* 8.3*    PT/INR: No results for input(s): LABPROT, INR in the last 72 hours. ABG    Component Value Date/Time   PHART 7.412 09/29/2020 1523   HCO3 22.4 09/29/2020 1523   ACIDBASEDEF 1.6 09/29/2020 1523   O2SAT 96.4 09/29/2020 1523   CBG (last 3)  Recent Labs    10/03/20 1950 10/03/20 2329 10/04/20 0356  GLUCAP 212* 163* 150*    Assessment/Plan: S/P Procedure(s) (LRB): XI ROBOTIC ASSISTED THORASCOPY-RIGHT LOWER LOBE BASILAR SEGMENTECTOMY  (Right) INTERCOSTAL NERVE BLOCK (Right)  1. CV- NSR, BP stable- continue home antihypertensives 2. Pulm- air leak resolved on water seal, CXR w/o significant pneumothorax, possibly d/c chest tube today 3. Renal- creatinine improved to 1.00, lytes okay 4. D/C Central line 5. DM- sugars improved, continue Levemir for now will transition to Metformin in AM if sugars remain control 6. Dispo- patient stable, air leak resolved, possibly remove chest tube today, creatinine improved, blood sugars improved, continue current care   LOS: 2 days    Jacob Handler, PA-C 10/04/2020 Patient seen and examined, agree with above He is taking oxycodone q4 and tramadol- will dc tramadol Will order PRN Toradol and monitor renal function No air leak but > 500 ml out over past 24 hours- leave CT in today Ambulate Will go ahead and restart metformin  Jacob Lipps C. Roxan Hockey, MD [facility] [phone]

## 2020-10-04 NOTE — Progress Notes (Signed)
RN will place patient on CPAP.

## 2020-10-04 NOTE — Plan of Care (Signed)
  Problem: Education: Goal: Knowledge of disease or condition will improve Outcome: Progressing Goal: Knowledge of the prescribed therapeutic regimen will improve Outcome: Progressing   Problem: Activity: Goal: Risk for activity intolerance will decrease Outcome: Progressing   Problem: Cardiac: Goal: Will achieve and/or maintain hemodynamic stability Outcome: Progressing   Problem: Clinical Measurements: Goal: Postoperative complications will be avoided or minimized Outcome: Progressing   Problem: Respiratory: Goal: Respiratory status will improve Outcome: Progressing   Problem: Pain Management: Goal: Pain level will decrease Outcome: Progressing   Problem: Skin Integrity: Goal: Wound healing without signs and symptoms infection will improve Outcome: Progressing   Problem: Education: Goal: Knowledge of General Education information will improve Description: Including pain rating scale, medication(s)/side effects and non-pharmacologic comfort measures Outcome: Progressing   Problem: Clinical Measurements: Goal: Ability to maintain clinical measurements within normal limits will improve Outcome: Progressing Goal: Will remain free from infection Outcome: Progressing Goal: Diagnostic test results will improve Outcome: Progressing Goal: Respiratory complications will improve Outcome: Progressing   Problem: Elimination: Goal: Will not experience complications related to bowel motility Outcome: Progressing Goal: Will not experience complications related to urinary retention Outcome: Progressing   Problem: Pain Managment: Goal: General experience of comfort will improve Outcome: Progressing   Problem: Safety: Goal: Ability to remain free from injury will improve Outcome: Progressing   Problem: Skin Integrity: Goal: Risk for impaired skin integrity will decrease Outcome: Progressing

## 2020-10-05 ENCOUNTER — Inpatient Hospital Stay (HOSPITAL_COMMUNITY): Payer: BC Managed Care – PPO

## 2020-10-05 LAB — BASIC METABOLIC PANEL
Anion gap: 10 (ref 5–15)
BUN: 22 mg/dL — ABNORMAL HIGH (ref 6–20)
CO2: 21 mmol/L — ABNORMAL LOW (ref 22–32)
Calcium: 8.5 mg/dL — ABNORMAL LOW (ref 8.9–10.3)
Chloride: 106 mmol/L (ref 98–111)
Creatinine, Ser: 1.01 mg/dL (ref 0.61–1.24)
GFR, Estimated: >60 mL/min (ref >60)
Glucose, Bld: 178 mg/dL — ABNORMAL HIGH (ref 70–99)
Potassium: 4.1 mmol/L (ref 3.5–5.1)
Sodium: 137 mmol/L (ref 135–145)

## 2020-10-05 LAB — GLUCOSE, CAPILLARY
Glucose-Capillary: 163 mg/dL — ABNORMAL HIGH (ref 70–99)
Glucose-Capillary: 151 mg/dL — ABNORMAL HIGH (ref 70–99)
Glucose-Capillary: 162 mg/dL — ABNORMAL HIGH (ref 70–99)
Glucose-Capillary: 153 mg/dL — ABNORMAL HIGH (ref 70–99)

## 2020-10-05 LAB — HEMOGLOBIN A1C
Hgb A1c MFr Bld: 9.3 % — ABNORMAL HIGH (ref 4.8–5.6)
Mean Plasma Glucose: 220.21 mg/dL

## 2020-10-05 LAB — SURGICAL PATHOLOGY

## 2020-10-05 MED ORDER — LACTULOSE 10 GM/15ML PO SOLN
20.0000 g | Freq: Every day | ORAL | Status: DC | PRN
  Administered 2020-10-05: 20 g via ORAL
  Filled 2020-10-05: qty 30

## 2020-10-05 MED ORDER — BISACODYL 10 MG RE SUPP
10.0000 mg | Freq: Every day | RECTAL | Status: DC | PRN
  Administered 2020-10-05: 10 mg via RECTAL

## 2020-10-05 MED ORDER — ACETAMINOPHEN 500 MG PO TABS
1000.0000 mg | ORAL_TABLET | Freq: Four times a day (QID) | ORAL | 0 refills | Status: DC | PRN
Start: 2020-10-05 — End: 2021-11-23

## 2020-10-05 NOTE — Progress Notes (Signed)
Pulled Pt's chest tube, Pt tolerated well, No s/s of distress.  HR and Respirations Normal Pt was instruction to stay in bed for 1 hour following tube removal and to call if any Shortness of breath or notice drainage from site.

## 2020-10-05 NOTE — Progress Notes (Signed)
SATURATION QUALIFICATIONS: (This note is used to comply with regulatory documentation for home oxygen)  Patient Saturations on Room Air at Rest = 91%  Patient Saturations on Room Air while Ambulating = 87%  Patient Saturations on 2 Liters of oxygen while Ambulating = 93%  Please briefly explain why patient needs home oxygen:  Pt will need oxygen at home with activity.

## 2020-10-05 NOTE — Discharge Summary (Addendum)
Physician Discharge Summary  Patient ID: Jacob Orozco MRN: [id number] DOB/AGE: 1961/10/25 59 y.o.  Admit date: 10/02/2020 Discharge date: 10/06/2020  Admission Diagnoses:Right lower lobe lung nodule  Patient Active Problem List   Diagnosis Date Noted  . Obesity with alveolar hypoventilation and body mass index (BMI) of 40 or greater ([place]) 09/12/2020  . Sleep apnea 09/12/2020  . Hyperlipidemia 09/12/2020  . Essential hypertension 09/12/2020  . COPD (chronic obstructive pulmonary disease) ([place]) 09/12/2020  . Type 2 diabetes mellitus ([place]) 09/12/2020  . Diabetic neuropathy associated with type 2 diabetes mellitus ([place]) 09/12/2020  . 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 S/P Robotic Assisted Thoracosocpy with Right Lower Lobe Basilar Segmentectomy 10/02/2020  . Obesity with alveolar hypoventilation and body mass index (BMI) of 40 or greater ([place]) 09/12/2020  . Sleep apnea 09/12/2020  . Hyperlipidemia 09/12/2020  . Essential hypertension 09/12/2020  . COPD (chronic obstructive pulmonary disease) ([place]) 09/12/2020  . Type 2 diabetes mellitus ([place]) 09/12/2020  . Diabetic neuropathy associated with type 2 diabetes mellitus ([place]) 09/12/2020  . Lung nodule 06/21/2020   Discharged Condition: good  History of Present Illness:  Jacob Orozco is a 59 yo male with a history of tobacco abuse, COPD, obesity, OSA, Type 2 DM complicated by neuropathy, HTN, and Hyperlipidemia.  The patient developed complaints of shortness of breath in July of this year.  He was diagnosed with pneumonia and treated with antibiotics.  Also during that workup the patient was found to have a right lower lobe lung nodule.  This was confirmed with CT scan and was 3.6 x 2.1 cm.  He underwent PET CT which showed low level of activity.  A repeat CT scan was performed 3 months later and there was little  if any change in size.  Jacob Orozco evaluated the patient and felt he should undergo right lower lobectomy.  It was also recommended the patient undergo pulmonary rehab prior to proceeding with surgery.  He presented to Jacob Orozco for a second opinion.  The patient admits to experiencing shortness of breath with exertion.  He can walk up a flight of stairs but has to rest at the top.  The patient has been trying to lose weight and was able to lose about 10 lbs in the past 3 months.  The patient denied chest pain, pressure, or tightness.  He does have a lot of problems with neuropathy.  It was the felt the patient would require surgical resection.  He was offered radiographic follow up, bronchoscopy with biopsy, and finally surgical resection.  The patient ultimately decided to proceed with surgical resection.  The risks and benefits of the procedure were explained to the patient and he was agreeable to proceed.  Hospital course:  [name] presented to [facility].  He was taken to the operating room and underwent Robotic Assisted Thoracoscopy with Basilar segmentectomy of right lower lobe and intercostal nerve block.  He tolerated the procedure without difficulty, the patient was extubated and taken to the PACU in stable condition.  The patient resumed his nightly CPAP regimen postoperatively.  His chest tube exhibited an air leak with cough. CXR showed a small apical space vs. Pneumothorax.  His chest tube remained on water seal with stable appearance of CXR.  His air leak resolved on 10/04/2020.  His chest tube output steadily decreased and was removed on 10/05/2020.  Follow up CXR showed a small apical space.  The patient developed an elevation in his creatinine level.  This was felt to be due to Toradol usage, thus the medication was discontinued.  He is diabetic and his post operative sugars were elevated ranging from 200-300s.  Due to his elevated creatinine he was treated with Levemir and  SSIP coverage.  Once creatinine improved he was restarted on his home Metformin prior to discharge.  The patient was weaned off oxygen as tolerated.  He was evaluated by PT who recommended no further discharge needs.  The patients surgical incisions are healing without difficulty.  He is ambulating with assistance.  He is medically stable for discharge home today.  PATHOLOGY:  SURGICAL PATHOLOGY  CASE: [id number]  PATIENT: Jacob Orozco  Surgical Pathology Report   Clinical History: RLL nodule (nt)   FINAL MICROSCOPIC DIAGNOSIS:   A. LUNG, RIGHT LOWER LOBE BASILAR SEGMENTS, RESECTION:  - Typical carcinoid, spanning 1.6 cm.  - Tumor is limited to lung.  - Resection margins are negative.  - Dilated bronchus with chronic inflammation.  - See oncology table.   B. LYMPH NODE, LEVEL 8, BIOPSY:  - One of one lymph nodes negative for carcinoma (0/1).   C. LYMPH NODE, LEVEL 7, BIOPSY:  - One of one lymph nodes negative for carcinoma (0/1).   D. LYMPH NODE, LEVEL 12, BIOPSY:  - One of one lymph nodes negative for carcinoma (0/1).   Treatments: surgery:   Xi robotic right video-assisted thoracoscopy, right lower lobe basilar segmentectomy.  Intercostal nerve blocks at levels 3 through 10.  Discharge Exam: Blood pressure (!) 154/75, pulse 91, temperature 98.2 F (36.8 C), temperature source Oral, resp. rate 20, height 5\' 10"  (1.778 m), weight 128.9 kg, SpO2 92 %.  General appearance: alert, cooperative and no distress Heart: regular rate and rhythm Lungs: clear to auscultation bilaterally Abdomen: soft, non-tender; bowel sounds normal; no masses,  no organomegaly Extremities: extremities normal, atraumatic, no cyanosis or edema Wound: clean and dry   Allergies as of 10/06/2020      Reactions   Penicillins Anaphylaxis   Codeine Anxiety   hyperactive      Medication List    TAKE these medications   acetaminophen 500 MG tablet Commonly known as: TYLENOL Take 2 tablets  (1,000 mg total) by mouth every 6 (six) hours as needed.   albuterol 0.63 MG/3ML nebulizer solution Commonly known as: ACCUNEB Take 1 ampule by nebulization every 6 (six) hours as needed for wheezing or shortness of breath.   amLODipine-valsartan 5-160 MG tablet Commonly known as: EXFORGE Take 1 tablet by mouth daily.   atorvastatin 10 MG tablet Commonly known as: LIPITOR Take 10 mg by mouth daily.   hydrOXYzine 10 MG tablet Commonly known as: ATARAX/VISTARIL Take 10 mg by mouth at bedtime.   metFORMIN 500 MG 24 hr tablet Commonly known as: GLUCOPHAGE-XR Take 500 mg by mouth in the morning and at bedtime.   oxyCODONE 5 MG immediate release tablet Commonly known as: Oxy IR/ROXICODONE Take 1-2 tablets (5-10 mg total) by mouth every 4 (four) hours as needed for moderate pain.   Trelegy Ellipta 100-62.5-25 MCG/INH Aepb Generic drug: Fluticasone-Umeclidin-Vilant Inhale 1 Inhaler into the lungs in the morning.       Follow-up Information    Melrose Nakayama, MD Follow up on 10/12/2020.   Specialty: Cardiothoracic Surgery Why: Appointment is at 3:45, please get CXR at 3:15 at [facility] located on first floor of our office building  Contact information: [address] [place] [address] [phone]               Signed: Ellwood Handler, PA-C  10/06/2020, 7:11 AM

## 2020-10-05 NOTE — Progress Notes (Signed)
PT Cancellation Note  Patient Details Name: CRISTOVAL TEALL MRN: [id number] DOB: 03-12-1961   Cancelled Treatment:    Reason Eval/Treat Not Completed: Medical issues which prohibited therapy Pt with chest tube removal and must wait 1 hour prior to mobility. Will follow up as pt appropriate.   Reuel Derby, PT, DPT  Acute Rehabilitation Services  Pager: [phone] Office: [phone]    Rudean Hitt 10/05/2020, 9:01 AM

## 2020-10-05 NOTE — Progress Notes (Addendum)
      [address]       Jacob Orozco,[place]ob [place]roz[place] [address]             [phone]      3 Days Post-Op Procedure(s) (LRB): XI ROBOTIC ASSISTED THORASCOPY-RIGHT LOWER LOBE BASILAR SEGMENTECTOMY (Right) INTERCOSTAL NERVE BLOCK (Right)   Subjective:  Patient states he wants to go home today.  Up in chair and ambulated some yesterday.  Nursing in the room and states patient desats into low 80s without oxygen.  No BM yet, + flatus  Objective: Vital signs in last 24 hours: Temp:  [98.3 F (36.8 C)-98.5 F (36.9 C)] 98.5 F (36.9 C) (12/09 0347) Pulse Rate:  [70-95] 70 (12/09 0400) Cardiac Rhythm: Normal sinus rhythm (12/09 0724) Resp:  [17-24] 17 (12/09 0400) BP: (116-147)/(58-77) 126/58 (12/09 0400) SpO2:  [87 %-96 %] 96 % (12/09 0400)  Intake/Output from previous day: 12/08 0701 - 12/09 0700 In: 720 [P.O.:720] Out: 1130 [Urine:975; Chest Tube:155]  General appearance: alert, cooperative and no distress Heart: regular rate and rhythm Lungs: diminished breath sounds bibasilar Abdomen: soft, non-tender; bowel sounds normal; no masses,  no organomegaly Extremities: extremities normal, atraumatic, no cyanosis or edema Wound: clean and dry  Lab Results: Recent Labs    10/03/20 0410 10/04/20 0046  WBC 15.4* 15.4*  HGB 12.0* 12.8*  HCT 35.8* 39.0  PLT 163 174   BMET:  Recent Labs    10/04/20 0046 10/05/20 0030  NA 138 137  K 4.2 4.1  CL 108 106  CO2 21* 21*  GLUCOSE 171* 178*  BUN 23* 22*  CREATININE 1.00 1.01  CALCIUM 8.3* 8.5*    PT/INR: No results for input(s): LABPROT, INR in the last 72 hours. ABG    Component Value Date/Time   PHART 7.412 09/29/2020 1523   HCO3 22.4 09/29/2020 1523   ACIDBASEDEF 1.6 09/29/2020 1523   O2SAT 96.4 09/29/2020 1523   CBG (last 3)  Recent Labs    10/04/20 1604 10/04/20 2119 10/05/20 0605  GLUCAP 193* 159* 163*    Assessment/Plan: S/P Procedure(s) (LRB): XI ROBOTIC ASSISTED THORASCOPY-RIGHT LOWER LOBE BASILAR  SEGMENTECTOMY (Right) INTERCOSTAL NERVE BLOCK (Right)  1. CV- NSR, + HTN- continue home antihypertensives 2. Pulm- CT output 155 yesterday, no air leak, CXR stable, no pneumothorax, possibly d/c chest tube today... + hypoxia nurse will ambulate and document O2 sats for likely home use 3. Lovenox for DVT prophylaxis 4. DM-sugars controlled, metformin has been restarted 5. Dispo- patient stable, CT output 155 yesterday likely d/c chest tube today, will obtain oxygen saturation levels, will likely require home oxygen, blood sugars controlled, patient wishes to go home today, if able would be this afternoon vs tomorrow AM   LOS: 3 days    Ellwood Handler, PA-C 10/05/2020

## 2020-10-05 NOTE — TOC Progression Note (Signed)
Transition of Care [facility]) - Progression Note    Patient Details  Name: Jacob Orozco MRN: [id number] Date of Birth: 1960-11-22  Transition of Care [facility]) CM/SW Contact  Zenon Mayo, RN Phone Number: 10/05/2020, 4:37 PM  Clinical Narrative:    NCM spoke with patient, he states he would like his oxygen to come from [facility] since he has his cpap thru them, phone is [phone].  NCM contacted Lovena Le at [facility] she states to fax the order to [phone].  NCM faxed the order, demographics, h/p and saturations.  Lovena Le the rep states they do not have anyone who can bring the oxygen tank to the hospital , they can set up the concentrator at his home and then a family should bring it to him her at the hospital.  NCM asked the patient about this and he states he will deal with this in the am , he does not have time to deal with this now.          Expected Discharge Plan and Services                                                 Social Determinants of Health (SDOH) Interventions    Readmission Risk Interventions No flowsheet data found.

## 2020-10-05 NOTE — Evaluation (Signed)
Physical Therapy Evaluation Patient Details Name: Jacob Orozco MRN: [id number] DOB: [birth year] Today's Date: 10/05/2020   History of Present Illness  Pt is a 59 y/o male s/p partial lobectomy of lung. PMH includes COPD, DM, and HTN.  Clinical Impression  Pt admitted secondary to problem above with deficits below. Pt with increased fatigue with gait this session. Pt requiring min guard A for transfers and gait. Oxygen sats decreasing to 87% at end of gait on RA. Required seated rest and pursed lip breathing to return to >90% on RA. Anticipate pt will progress well and will not require follow up PT. Will continue to follow acutely to maximize functional mobility independence and safety.     Follow Up Recommendations No PT follow up    Equipment Recommendations  None recommended by PT    Recommendations for Other Services       Precautions / Restrictions Precautions Precautions: Fall;Other (comment) Precaution Comments: Watch O2 Restrictions Weight Bearing Restrictions: No      Mobility  Bed Mobility               General bed mobility comments: In chair upon entry    Transfers Overall transfer level: Needs assistance Equipment used: None Transfers: Sit to/from Stand Sit to Stand: Min guard         General transfer comment: Min guard for safety.  Ambulation/Gait Ambulation/Gait assistance: Min guard Gait Distance (Feet): 100 Feet Assistive device: None Gait Pattern/deviations: Step-through pattern;Decreased stride length Gait velocity: Decreased   General Gait Details: Pt fatiguing easily during gait. Holding on rails occasionally for support. Oxygen sats decreased to 87% on RA, however, returned to >90% with seated rest.  Stairs            Wheelchair Mobility    Modified Rankin (Stroke Patients Only)       Balance Overall balance assessment: Mild deficits observed, not formally tested                                            Pertinent Vitals/Pain Pain Assessment: 0-10 Pain Score: 3  Pain Location: where chest tube site was Pain Descriptors / Indicators: Aching;Sore Pain Intervention(s): Limited activity within patient's tolerance;Monitored during session;Repositioned    Home Living Family/patient expects to be discharged to:: Private residence Living Arrangements: Children Available Help at Discharge: Family;Available 24 hours/day Type of Home: House Home Access: Level entry     Home Layout: One level Home Equipment: Other (comment);Grab bars - tub/shower;Walker - 2 wheels (grab bars in hall)      Prior Function Level of Independence: Independent               Hand Dominance        Extremity/Trunk Assessment   Upper Extremity Assessment Upper Extremity Assessment: Overall WFL for tasks assessed    Lower Extremity Assessment Lower Extremity Assessment: Generalized weakness    Cervical / Trunk Assessment Cervical / Trunk Assessment: Normal  Communication   Communication: No difficulties  Cognition Arousal/Alertness: Awake/alert Behavior During Therapy: WFL for tasks assessed/performed Overall Cognitive Status: Within Functional Limits for tasks assessed                                        General Comments      Exercises  Assessment/Plan    PT Assessment Patient needs continued PT services  PT Problem List Decreased mobility;Decreased activity tolerance;Cardiopulmonary status limiting activity       PT Treatment Interventions Gait training;Functional mobility training;Therapeutic activities;Balance training;Therapeutic exercise;Patient/family education    PT Goals (Current goals can be found in the Care Plan section)  Acute Rehab PT Goals Patient Stated Goal: to go home PT Goal Formulation: With patient Time For Goal Achievement: 10/19/20 Potential to Achieve Goals: Good    Frequency Min 3X/week   Barriers to discharge         Co-evaluation               AM-PAC PT "6 Clicks" Mobility  Outcome Measure Help needed turning from your back to your side while in a flat bed without using bedrails?: None Help needed moving from lying on your back to sitting on the side of a flat bed without using bedrails?: A Little Help needed moving to and from a bed to a chair (including a wheelchair)?: A Little Help needed standing up from a chair using your arms (e.g., wheelchair or bedside chair)?: A Little Help needed to walk in hospital room?: A Little Help needed climbing 3-5 steps with a railing? : A Little 6 Click Score: 19    End of Session   Activity Tolerance: Patient limited by fatigue Patient left: in chair;with call bell/phone within reach Nurse Communication: Mobility status PT Visit Diagnosis: Other abnormalities of gait and mobility (R26.89)    Time: 6578-4696 PT Time Calculation (min) (ACUTE ONLY): 11 min   Charges:   PT Evaluation $PT Eval Moderate Complexity: 1 Mod          Lou Miner, DPT  Acute Rehabilitation Services  Pager: [phone] Office: [phone]   Rudean Hitt 10/05/2020, 1:41 PM

## 2020-10-06 ENCOUNTER — Other Ambulatory Visit: Payer: Self-pay | Admitting: Physician Assistant

## 2020-10-06 ENCOUNTER — Inpatient Hospital Stay (HOSPITAL_COMMUNITY): Payer: BC Managed Care – PPO

## 2020-10-06 ENCOUNTER — Other Ambulatory Visit: Payer: Self-pay | Admitting: Surgical

## 2020-10-06 DIAGNOSIS — D3A090 Benign carcinoid tumor of the bronchus and lung: Secondary | ICD-10-CM | POA: Diagnosis present

## 2020-10-06 LAB — GLUCOSE, CAPILLARY: Glucose-Capillary: 169 mg/dL — ABNORMAL HIGH (ref 70–99)

## 2020-10-06 MED ORDER — OXYCODONE HCL 5 MG PO TABS
5.0000 mg | ORAL_TABLET | ORAL | 0 refills | Status: DC | PRN
Start: 2020-10-06 — End: 2020-10-06

## 2020-10-06 MED ORDER — OXYCODONE HCL 5 MG PO TABS
5.0000 mg | ORAL_TABLET | Freq: Four times a day (QID) | ORAL | 0 refills | Status: DC | PRN
Start: 2020-10-06 — End: 2020-10-12

## 2020-10-06 MED ORDER — OXYCODONE HCL 5 MG PO TABS
5.0000 mg | ORAL_TABLET | Freq: Four times a day (QID) | ORAL | 0 refills | Status: DC | PRN
Start: 2020-10-06 — End: 2020-10-06

## 2020-10-06 MED ORDER — OXYCODONE HCL 5 MG PO TABS: 5.0000 mg | ORAL_TABLET | Freq: Four times a day (QID) | ORAL | 0 refills | Status: DC | PRN

## 2020-10-06 NOTE — TOC Transition Note (Signed)
Transition of Care [facility]) - CM/SW Discharge Note   Patient Details  Name: Jacob Orozco MRN: [id number] Date of Birth: [birth year]  Transition of Care [facility]) CM/SW Contact:  Zenon Mayo, RN Phone Number: 10/06/2020, 11:14 AM   Clinical Narrative:    Patient did not need the home oxygen, the referral was canceled for the home oxygen with [facility].         Patient Goals and CMS Choice        Discharge Placement                       Discharge Plan and Services                                     Social Determinants of Health (SDOH) Interventions     Readmission Risk Interventions No flowsheet data found.

## 2020-10-06 NOTE — Progress Notes (Signed)
Jacob Orozco was provided discharge instructions, including medication schedule and what each one was for, follow-up appointments, and other information regarding proceeding care post-stay. He denied any questions and/or concerns regarding his discharge instructions and future care. Jacob Orozco was then escorted via wheelchair in stable condition to his neighbor's car where he was assisted into passenger seat.

## 2020-10-06 NOTE — Progress Notes (Addendum)
      [address]       [place] [address]             [phone]      4 Days Post-Op Procedure(s) (LRB): XI ROBOTIC ASSISTED THORASCOPY-RIGHT LOWER LOBE BASILAR SEGMENTECTOMY (Right) INTERCOSTAL NERVE BLOCK (Right)   Subjective:  No new complaints.  States he is ready to go home.  States he can't sleep here and has been up all night.  He also states he is walking and is no longer needing oxygen.  He has also moved his bowels  Objective: Vital signs in last 24 hours: Temp:  [98.2 F (36.8 C)-99 F (37.2 C)] 98.2 F (36.8 C) (12/10 0328) Pulse Rate:  [77-105] 91 (12/10 0328) Cardiac Rhythm: Normal sinus rhythm (12/09 2015) Resp:  [19-27] 20 (12/10 0328) BP: (130-163)/(60-83) 154/75 (12/10 0328) SpO2:  [90 %-96 %] 92 % (12/10 0328)  Intake/Output from previous day: 12/09 0701 - 12/10 0700 In: 1340 [P.O.:1340] Out: 355 [Urine:325; Chest Tube:30]  General appearance: alert, cooperative and no distress Heart: regular rate and rhythm Lungs: clear to auscultation bilaterally Abdomen: soft, non-tender; bowel sounds normal; no masses,  no organomegaly Extremities: extremities normal, atraumatic, no cyanosis or edema Wound: clean and dry  Lab Results: Recent Labs    10/04/20 0046  WBC 15.4*  HGB 12.8*  HCT 39.0  PLT 174   BMET:  Recent Labs    10/04/20 0046 10/05/20 0030  NA 138 137  K 4.2 4.1  CL 108 106  CO2 21* 21*  GLUCOSE 171* 178*  BUN 23* 22*  CREATININE 1.00 1.01  CALCIUM 8.3* 8.5*    PT/INR: No results for input(s): LABPROT, INR in the last 72 hours. ABG    Component Value Date/Time   PHART 7.412 09/29/2020 1523   HCO3 22.4 09/29/2020 1523   ACIDBASEDEF 1.6 09/29/2020 1523   O2SAT 96.4 09/29/2020 1523   CBG (last 3)  Recent Labs    10/05/20 1603 10/05/20 2056 10/06/20 0618  GLUCAP 162* 153* 169*    Assessment/Plan: S/P Procedure(s) (LRB): XI ROBOTIC ASSISTED THORASCOPY-RIGHT LOWER LOBE BASILAR SEGMENTECTOMY  (Right) INTERCOSTAL NERVE BLOCK (Right)  1. CV- NSR, + HTN- continue home antihypertensives 2. Pulm- CXR with trace right apical space, off oxygen, continue IS at discharge 3. DM- sugars remain controlled on home Metformin 4. Dispo- patient stable, ambulating off oxygen, CXR with small right apical space, remains hemodynamically stable, will d/c home today   LOS: 4 days    Ellwood Handler, PA-C 10/06/2020 Patient seen and examined, agree with above  Remo Lipps C. Roxan Hockey, MD [facility] [phone]

## 2020-10-10 ENCOUNTER — Other Ambulatory Visit: Payer: Self-pay | Admitting: Thoracic Surgery (Cardiothoracic Vascular Surgery)

## 2020-10-10 DIAGNOSIS — D3A090 Benign carcinoid tumor of the bronchus and lung: Secondary | ICD-10-CM

## 2020-10-12 ENCOUNTER — Other Ambulatory Visit: Payer: Self-pay

## 2020-10-12 ENCOUNTER — Ambulatory Visit (INDEPENDENT_AMBULATORY_CARE_PROVIDER_SITE_OTHER): Payer: Self-pay | Admitting: Thoracic Surgery (Cardiothoracic Vascular Surgery)

## 2020-10-12 ENCOUNTER — Ambulatory Visit
Admission: RE | Admit: 2020-10-12 | Discharge: 2020-10-12 | Disposition: A | Discharge status: Home / Self Care | Payer: BC Managed Care – PPO | Source: Ambulatory Visit | Attending: Thoracic Surgery (Cardiothoracic Vascular Surgery) | Admitting: Thoracic Surgery (Cardiothoracic Vascular Surgery)

## 2020-10-12 ENCOUNTER — Other Ambulatory Visit: Payer: Self-pay | Admitting: *Deleted

## 2020-10-12 VITALS — BP 150/80 | HR 74 | Temp 97.6°F | Resp 20 | Ht 70.0 in | Wt 283.0 lb

## 2020-10-12 DIAGNOSIS — Z09 Encounter for follow-up examination after completed treatment for conditions other than malignant neoplasm: Secondary | ICD-10-CM

## 2020-10-12 DIAGNOSIS — D3A090 Benign carcinoid tumor of the bronchus and lung: Secondary | ICD-10-CM

## 2020-10-12 MED ORDER — OXYCODONE HCL 5 MG PO TABS: 5.0000 mg | ORAL_TABLET | Freq: Four times a day (QID) | ORAL | 0 refills | Status: DC | PRN

## 2020-10-12 MED ORDER — ALBUTEROL SULFATE HFA 108 (90 BASE) MCG/ACT IN AERS: 2.0000 | INHALATION_SPRAY | Freq: Four times a day (QID) | RESPIRATORY_TRACT | 2 refills | Status: DC | PRN

## 2020-10-12 MED ORDER — POTASSIUM CHLORIDE CRYS ER 20 MEQ PO TBCR: 20.0000 meq | EXTENDED_RELEASE_TABLET | Freq: Two times a day (BID) | ORAL | 0 refills | Status: DC

## 2020-10-12 MED ORDER — FUROSEMIDE 40 MG PO TABS: 40.0000 mg | ORAL_TABLET | Freq: Every day | ORAL | 0 refills | Status: DC

## 2020-10-12 NOTE — Progress Notes (Signed)
[address]       [place] [address]             [phone]     HPI: [name] returns for a scheduled follow-up visit  Jacob Orozco is a 59 year old man with a past medical history significant for hypertension, asthma, type 2 diabetes, sleep apnea, and obesity.  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  He went home on postoperative day #5.  He complains of incisional pain.  He has been taking oxycodone about 4 times a day.  He is running low on those.  He also complains of some shortness of breath.  He has been using an albuterol nebulizer in addition to his Trelegy.  He is requesting a rescue inhaler in case he gets short of breath while he is out of the house.  He has been walking about 1/4 mile 3 times a day.  Past Medical History:  Diagnosis Date  . Asthma   . Diabetes mellitus without complication ([place])   . Hypertension   . Sleep apnea    cpap    Current Outpatient Medications  Medication Sig Dispense Refill  . acetaminophen (TYLENOL) 500 MG tablet Take 2 tablets (1,000 mg total) by mouth every 6 (six) hours as needed. 30 tablet 0  . albuterol (ACCUNEB) 0.63 MG/3ML nebulizer solution Take 1 ampule by nebulization every 6 (six) hours as needed for wheezing or shortness of breath.     Marland Kitchen amLODipine-valsartan (EXFORGE) 5-160 MG tablet Take 1 tablet by mouth daily.    Marland Kitchen atorvastatin (LIPITOR) 10 MG tablet Take 10 mg by mouth daily.     . Fluticasone-Umeclidin-Vilant (TRELEGY ELLIPTA) 100-62.5-25 MCG/INH AEPB Inhale 1 Inhaler into the lungs in the morning.    . hydrOXYzine (ATARAX/VISTARIL) 10 MG tablet Take 10 mg by mouth at bedtime.     . metFORMIN (GLUCOPHAGE-XR) 500 MG 24 hr tablet Take 500 mg by mouth in the morning and at bedtime.    Marland Kitchen albuterol (VENTOLIN HFA) 108 (90 Base) MCG/ACT inhaler Inhale 2 puffs into the lungs every 6 (six) hours as needed for wheezing or shortness of breath. 8  g 2  . furosemide (LASIX) 40 MG tablet Take 1 tablet (40 mg total) by mouth daily. 14 tablet 0  . oxyCODONE (OXY IR/ROXICODONE) 5 MG immediate release tablet Take 1 tablet (5 mg total) by mouth every 6 (six) hours as needed for severe pain. 28 tablet 0  . potassium chloride SA (KLOR-CON) 20 MEQ tablet Take 1 tablet (20 mEq total) by mouth 2 (two) times daily. 145 tablet 0   No current facility-administered medications for this visit.    Physical Exam BP (!) 150/80   Pulse 74   Temp 97.6 F (36.4 C) (Skin)   Resp 20   Ht 5\' 10"  (1.778 m)   Wt 283 lb (128.4 kg)   SpO2 95% Comment: RA  BMI 40.65 kg/m  59 year old man in no acute distress Alert and oriented x3 with no focal deficits Lungs diminished at bases bilaterally right greater than left, no wheezing Cardiac regular rate and rhythm Incisions clean dry and intact Trace edema lower extremities  Diagnostic Tests: CHEST - 2 VIEW  COMPARISON:  10/06/2020  FINDINGS: Cardiac shadow is stable. Mild volume loss on the right is noted with small right-sided pleural effusion which is slightly larger than that seen on the prior exam. Stable apical pneumothorax is  noted. Left lung remains clear. No bony abnormality is seen.  IMPRESSION: Postsurgical changes on the right with mild volume loss and small right-sided apical pneumothorax.  Slight increase in right pleural effusion is noted.   Electronically Signed   By: Inez Catalina M.D.   On: 10/12/2020 12:14 I personally reviewed the chest x-ray images and concur with the findings noted above  Impression: Jacob Orozco is a 59 year old gentleman with a past medical history significant for hypertension, asthma, type 2 diabetes, sleep apnea, and obesity.  He is now 10 days out from a basilar segmentectomy for a carcinoid tumor of the right lower lobe.  He still has some incisional pain.  I am going to refill his oxycodone.  5 mg tablets, 1 every 6 hours as needed for pain,  28 tablets, no refills.  He does appear to be a little volume overloaded on exam.  We will put him on Lasix and potassium for a couple of weeks.  He is requesting to drive about a block to his father's house.  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  I told him he could do that but I did not want him driving elsewhere until he is feeling better.  He is requesting an rescue inhaler in case he needs something when he is out of the house.  I gave him a prescription for an albuterol MDI 2 puffs every 6 hours as needed.  He knows not to use that and the albuterol nebulizer that he has at home within 6 hours of each other.  I will plan to see him back in about 2 weeks to check on his progress.  At that point we can discuss further increase in his activities and when he might be able to return to work.  Plan: Lasix 40 mg daily and potassium 20 mEq daily x2 weeks Return in 2 weeks with PA lateral chest x-ray Albuterol MDI 2 puffs every 6 as needed Refilled oxycodone  Melrose Nakayama, MD [facility] [phone]

## 2020-10-12 NOTE — Progress Notes (Signed)
The proposed treatment discussed in cancer conference 10/12/20 is for discussion purpose only and is not a binding recommendation.  The patient was not physically examined nor present for their treatment options.  Therefore, final treatment plans cannot be decided.

## 2020-10-30 ENCOUNTER — Other Ambulatory Visit: Payer: Self-pay | Admitting: Thoracic Surgery (Cardiothoracic Vascular Surgery)

## 2020-10-30 DIAGNOSIS — D3A090 Benign carcinoid tumor of the bronchus and lung: Secondary | ICD-10-CM

## 2020-10-31 ENCOUNTER — Encounter: Payer: Self-pay | Admitting: *Deleted

## 2020-10-31 ENCOUNTER — Ambulatory Visit (INDEPENDENT_AMBULATORY_CARE_PROVIDER_SITE_OTHER): Payer: Self-pay | Admitting: Thoracic Surgery (Cardiothoracic Vascular Surgery)

## 2020-10-31 ENCOUNTER — Encounter: Payer: Self-pay | Admitting: Thoracic Surgery (Cardiothoracic Vascular Surgery)

## 2020-10-31 ENCOUNTER — Ambulatory Visit
Admission: RE | Admit: 2020-10-31 | Discharge: 2020-10-31 | Disposition: A | Discharge status: Home / Self Care | Payer: BC Managed Care – PPO | Source: Ambulatory Visit | Attending: Thoracic Surgery (Cardiothoracic Vascular Surgery) | Admitting: Thoracic Surgery (Cardiothoracic Vascular Surgery)

## 2020-10-31 ENCOUNTER — Other Ambulatory Visit: Payer: Self-pay

## 2020-10-31 VITALS — BP 123/76 | HR 82 | Resp 20 | Ht 70.0 in | Wt 271.0 lb

## 2020-10-31 DIAGNOSIS — D3A090 Benign carcinoid tumor of the bronchus and lung: Secondary | ICD-10-CM

## 2020-10-31 DIAGNOSIS — Z09 Encounter for follow-up examination after completed treatment for conditions other than malignant neoplasm: Secondary | ICD-10-CM

## 2020-10-31 MED ORDER — OXYCODONE HCL 5 MG PO TABS: 5.0000 mg | ORAL_TABLET | Freq: Four times a day (QID) | ORAL | 0 refills | Status: DC | PRN

## 2020-10-31 NOTE — Progress Notes (Signed)
I received a message from Dr. Leonarda Salon office of a new patient referral.  I updated new patient coordinator to call and schedule him to be seen with Dr. Julien Nordmann on 11/13/20 with labs

## 2020-10-31 NOTE — Progress Notes (Signed)
Jacob Orozco       [place] [address]             [phone]    HPI: Jacob Orozco returns for follow-up after resection of a carcinoid tumor of the right lower lobe.  Jacob Orozco is a 60 year old man with a past history significant for hypertension, obesity, asthma, type 2 diabetes, sleep apnea, and tobacco abuse.  He had a robotic right lower lobe basilar segmentectomy on 10/02/2020.  He did well postoperatively and went home on day 5.  I saw him in the office on 10/12/2020.  He was having a lot of incisional pain and was using oxycodone about 4 times a day.  He also was having some shortness of breath and I put him on Lasix for 2 weeks.  He is still having some issues with incisional pain.  He is taking oxycodone once or twice a day at this point.  He is using Tylenol as well.  He continues to have some shortness of breath with exertion, but has seen some improvement in that.  Past Medical History:  Diagnosis Date  . Asthma   . Diabetes mellitus without complication ([place])   . Hypertension   . Sleep apnea    cpap    Current Outpatient Medications  Medication Sig Dispense Refill  . acetaminophen (TYLENOL) 500 MG tablet Take 2 tablets (1,000 mg total) by mouth every 6 (six) hours as needed. 30 tablet 0  . albuterol (ACCUNEB) 0.63 MG/3ML nebulizer solution Take 1 ampule by nebulization every 6 (six) hours as needed for wheezing or shortness of breath.     Marland Kitchen albuterol (VENTOLIN HFA) 108 (90 Base) MCG/ACT inhaler Inhale 2 puffs into the lungs every 6 (six) hours as needed for wheezing or shortness of breath. 8 g 2  . amLODipine-valsartan (EXFORGE) 5-160 MG tablet Take 1 tablet by mouth daily.    Marland Kitchen atorvastatin (LIPITOR) 10 MG tablet Take 10 mg by mouth daily.     . Fluticasone-Umeclidin-Vilant (TRELEGY ELLIPTA) 100-62.5-25 MCG/INH AEPB Inhale 1 Inhaler into the lungs in the morning.    . hydrOXYzine (ATARAX/VISTARIL) 10 MG tablet Take 10 mg by mouth at bedtime.      . metFORMIN (GLUCOPHAGE-XR) 500 MG 24 hr tablet Take 500 mg by mouth in the morning and at bedtime.    Marland Kitchen oxyCODONE (OXY IR/ROXICODONE) 5 MG immediate release tablet Take 1 tablet (5 mg total) by mouth every 6 (six) hours as needed for severe pain. 28 tablet 0  . furosemide (LASIX) 40 MG tablet Take 1 tablet (40 mg total) by mouth daily. (Patient not taking: Reported on 10/31/2020) 14 tablet 0   No current facility-administered medications for this visit.    Physical Exam BP 123/76   Pulse 82   Resp 20   Ht 5\' 10"  (1.778 m)   Wt 271 lb (122.9 kg)   SpO2 95% Comment: RA  BMI 38.39 kg/m  60 year old man in no acute distress Alert and oriented x3 with no focal deficits Incisions clean dry and intact Cardiac regular rate and rhythm Lungs diminished at right base but otherwise clear  Diagnostic Tests: CHEST - 2 VIEW  COMPARISON:  None.  FINDINGS: Slight increase in a small to moderate right pleural effusion. No visible pneumothorax. Similar right-sided volume loss. Left lung is clear. Cardiomediastinal silhouette is within normal limits and similar to prior.  IMPRESSION: 1. Postsurgical changes on the right with mild volume loss. No visible pneumothorax.  2. Slight increase in a small to moderate right pleural effusion.   Electronically Signed   By: Margaretha Sheffield MD I personally reviewed the chest x-ray images.  I think there is stable to slight decrease in the effusion.  Certainly no significant increase.  Normal finding post basilar segmentectomy.  Impression: Jacob Orozco is a 60 year old man with a past history significant for hypertension, obesity, asthma, type 2 diabetes, sleep apnea, and tobacco abuse.  He recently had a basilar segmentectomy for a stage Ia typical carcinoid tumor.  He continues to have some incisional pain.  He still taking narcotics for that.  He is also taking acetaminophen.  I strongly urged him to wean himself off the narcotics over  the next several weeks.  He has already cut back significantly.  I think at this point he can start driving whenever he is not having to take the narcotic pain medication during the day.  He is aware of his limitations and we discussed appropriate precautions.  He had questions about returning to work but I think it will be at least another month and probably about 6 weeks before he is ready to do so.  He is running low on oxycodone.  I I sent a new prescription for that to the [facility] in [place].  He has a stage Ia typical carcinoid tumor.  He will need any adjuvant therapy.  We do need to get him in with oncology.  Plan: Refer to Dr. Julien Nordmann, oncology, new stage Ia carcinoid tumor. Sent another prescription for oxycodone 28 tablets 1 p.o. every 6 hours as needed pain Return in 3 weeks to check on progress  Jacob Nakayama, MD [facility] [phone]

## 2020-11-02 ENCOUNTER — Telehealth: Payer: Self-pay | Admitting: Internal Medicine

## 2020-11-02 NOTE — Telephone Encounter (Signed)
Received a new hem referral from Dr. Roxan Hockey for Stage 1A carcinoid tumor. Jacob Orozco has been cld and scheduled to see Jacob Orozco on 1/17 at 2:15pm w/labs at 1:45pm. Pt aware to arrive 20 minutes early.

## 2020-11-13 ENCOUNTER — Inpatient Hospital Stay: Payer: BC Managed Care – PPO

## 2020-11-13 ENCOUNTER — Telehealth: Payer: Self-pay | Admitting: Medical Oncology

## 2020-11-13 ENCOUNTER — Inpatient Hospital Stay: Payer: BC Managed Care – PPO | Admitting: Internal Medicine

## 2020-11-13 NOTE — Telephone Encounter (Signed)
LM for pt to call back and confirm or r/s his appt today with Dr Julien Nordmann.

## 2020-11-16 ENCOUNTER — Encounter (HOSPITAL_COMMUNITY): Payer: Self-pay | Admitting: *Deleted

## 2020-11-16 ENCOUNTER — Emergency Department (HOSPITAL_COMMUNITY)
Admission: EM | Admit: 2020-11-16 | Discharge: 2020-11-16 | Disposition: A | Discharge status: Home / Self Care | Payer: BC Managed Care – PPO | Attending: Emergency Medicine | Admitting: Emergency Medicine

## 2020-11-16 DIAGNOSIS — Z5321 Procedure and treatment not carried out due to patient leaving prior to being seen by health care provider: Secondary | ICD-10-CM | POA: Insufficient documentation

## 2020-11-16 DIAGNOSIS — R0602 Shortness of breath: Secondary | ICD-10-CM | POA: Diagnosis not present

## 2020-11-16 DIAGNOSIS — K6289 Other specified diseases of anus and rectum: Secondary | ICD-10-CM | POA: Insufficient documentation

## 2020-11-16 NOTE — ED Notes (Signed)
Left at 1630

## 2020-11-16 NOTE — ED Triage Notes (Signed)
States he has shortness if breath, recently had surgery on right lower lung a month ago. Also has rectal pain and history of diarrhea.

## 2020-11-20 ENCOUNTER — Other Ambulatory Visit: Payer: Self-pay

## 2020-11-20 ENCOUNTER — Inpatient Hospital Stay: Payer: BC Managed Care – PPO

## 2020-11-20 ENCOUNTER — Encounter: Payer: Self-pay | Admitting: Internal Medicine

## 2020-11-20 ENCOUNTER — Other Ambulatory Visit: Payer: Self-pay | Admitting: Thoracic Surgery (Cardiothoracic Vascular Surgery)

## 2020-11-20 ENCOUNTER — Inpatient Hospital Stay: Payer: BC Managed Care – PPO | Attending: Internal Medicine | Admitting: Internal Medicine

## 2020-11-20 DIAGNOSIS — C3490 Malignant neoplasm of unspecified part of unspecified bronchus or lung: Secondary | ICD-10-CM

## 2020-11-20 DIAGNOSIS — D3A090 Benign carcinoid tumor of the bronchus and lung: Secondary | ICD-10-CM

## 2020-11-20 DIAGNOSIS — C3432 Malignant neoplasm of lower lobe, left bronchus or lung: Secondary | ICD-10-CM | POA: Diagnosis present

## 2020-11-20 DIAGNOSIS — C7A090 Malignant carcinoid tumor of the bronchus and lung: Secondary | ICD-10-CM

## 2020-11-20 LAB — CMP (CANCER CENTER ONLY)
ALT: 21 U/L (ref 0–44)
AST: 11 U/L — ABNORMAL LOW (ref 15–41)
Albumin: 3.2 g/dL — ABNORMAL LOW (ref 3.5–5.0)
Alkaline Phosphatase: 84 U/L (ref 38–126)
Anion gap: 9 (ref 5–15)
BUN: 12 mg/dL (ref 6–20)
CO2: 24 mmol/L (ref 22–32)
Calcium: 9.4 mg/dL (ref 8.9–10.3)
Chloride: 107 mmol/L (ref 98–111)
Creatinine: 1.03 mg/dL (ref 0.61–1.24)
GFR, Estimated: >60 mL/min (ref >60)
Glucose, Bld: 152 mg/dL — ABNORMAL HIGH (ref 70–99)
Potassium: 4.5 mmol/L (ref 3.5–5.1)
Sodium: 140 mmol/L (ref 135–145)
Total Bilirubin: 0.3 mg/dL (ref 0.3–1.2)
Total Protein: 7.5 g/dL (ref 6.5–8.1)

## 2020-11-20 LAB — CBC WITH DIFFERENTIAL (CANCER CENTER ONLY)
Abs Immature Granulocytes: 0.06 10*3/uL (ref 0.00–0.07)
Basophils Absolute: 0.1 10*3/uL (ref 0.0–0.1)
Basophils Relative: 1 %
Eosinophils Absolute: 0.3 10*3/uL (ref 0.0–0.5)
Eosinophils Relative: 4 %
HCT: 39.6 % (ref 39.0–52.0)
Hemoglobin: 13 g/dL (ref 13.0–17.0)
Immature Granulocytes: 1 %
Lymphocytes Relative: 25 %
Lymphs Abs: 2.4 10*3/uL (ref 0.7–4.0)
MCH: 27.4 pg (ref 26.0–34.0)
MCHC: 32.8 g/dL (ref 30.0–36.0)
MCV: 83.5 fL (ref 80.0–100.0)
Monocytes Absolute: 0.9 10*3/uL (ref 0.1–1.0)
Monocytes Relative: 10 %
Neutro Abs: 5.9 10*3/uL (ref 1.7–7.7)
Neutrophils Relative %: 59 %
Platelet Count: 232 10*3/uL (ref 150–400)
RBC: 4.74 MIL/uL (ref 4.22–5.81)
RDW: 14.3 % (ref 11.5–15.5)
WBC Count: 9.7 10*3/uL (ref 4.0–10.5)
nRBC: 0 % (ref 0.0–0.2)

## 2020-11-20 NOTE — Progress Notes (Signed)
[name] [phone]   [phone]  CONSULT NOTE  REFERRING PHYSICIAN: Dr. Modesto Charon  REASON FOR CONSULTATION:  60 years old white male recently diagnosed with lung cancer.  HPI Jacob Orozco is a 60 y.o. male with past medical history significant for diabetes mellitus, hypertension, obstructive sleep apnea as well as history of COPD, peripheral neuropathy and dyslipidemia.  The patient mentions that in July 2021 has been complaining of shortness of breath and he was seen by his primary care physician and treated for suspicious pneumonia with a course of antibiotics. During his evaluation, he had imaging studies including CT scan of the chest that showed a right lower lobe mass measuring 3.6 x 2.1 cm.  A PET scan was performed at [facility] on June 21, 2020 and it showed right lower lobe pulmonary nodule with no demonstration of high-grade FDG avidity.  The differential included infectious/inflammatory etiology versus indolent neoplasm.  There was enlarged bilateral axillary lymph nodes with hypermetabolic activity that were nonspecific and the reactive process was a possibility.  Repeat CT scan of the chest on August 16, 2020 showed similar right lower lobe lobulated pulmonary mass.  This mass was not FDG avid on the PET scan.  Indolent neoplasm remains a possibility however findings may represent mucus impaction possibly in the setting of more proximal obstruction by endobronchial lesion.  The scan also showed stable nonspecific bilateral axillary lymph nodes.  The patient was referred to Dr. Roxan Hockey and on October 02, 2020 he underwent right VATS, right lower lobe basilar segmentectomy with lymph node sampling.  The final pathology [id number]) showed typical carcinoid spanning 1.6 cm.  The tumor is limited to the lung and the resection margins were negative for malignancy.  There was no pleural or lymphovascular  invasion.  The sampled lymph nodes were negative for malignancy. Dr. Roxan Hockey kindly referred the patient to me today for evaluation and recommendation regarding his condition. When seen today the patient is feeling fine today with no concerning complaints except for mild soreness on the right side of the chest from the surgical scar.  He also has mild shortness of breath with exertion and mild cough with no hemoptysis.  He intentionally lost around 7 pounds in the last few weeks.  The patient denied having any nausea, vomiting, diarrhea or constipation.  He denied having any headache or visual changes. Family history significant for mother died from pulmonary fibrosis, father had heart disease and stroke and he still alive at age 8.  He had his son died from pulmonary fibrosis secondary to drug abuse at age 35. The patient is single and has 1 daughter and 1 granddaughter.  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  He also volunteer as EMT.  He has a history for smoking less than 1 pack/day for around 15 years and quit June 21, 2020.  He has no history of alcohol or drug abuse.  HPI  Past Medical History:  Diagnosis Date  . Asthma   . Diabetes mellitus without complication ([place])   . Hypertension   . Sleep apnea    cpap    Past Surgical History:  Procedure Laterality Date  . APPENDECTOMY    . CHOLECYSTECTOMY    . INTERCOSTAL NERVE BLOCK Right 10/02/2020   Procedure: INTERCOSTAL NERVE BLOCK;  Surgeon: Melrose Nakayama, MD;  Location: [facility];  Service: Thoracic;  Laterality: Right;  . LUNG SEGMENTECTOMY  Right 10/02/2020   [facility] ROBOTIC ASSISTED THORASCOPY-RIGHT LOWER   . thorascopy Right 10/02/2020   [facility] ROBOTIC ASSISTED THORASCOPY-RIGHT LOWER   . TONSILLECTOMY      No family history on file.  Social History Social History   Tobacco Use  . Smoking status: Former Smoker    Packs/day: 0.50    Types: Cigarettes    Quit date: 06/23/2020    Years  since quitting: 0.4  . Smokeless tobacco: Never Used  Vaping Use  . Vaping Use: Never used  Substance Use Topics  . Alcohol use: No  . Drug use: No    Allergies  Allergen Reactions  . Penicillins Anaphylaxis  . Codeine Anxiety    hyperactive    Current Outpatient Medications  Medication Sig Dispense Refill  . acetaminophen (TYLENOL) 500 MG tablet Take 2 tablets (1,000 mg total) by mouth every 6 (six) hours as needed. 30 tablet 0  . albuterol (ACCUNEB) 0.63 MG/3ML nebulizer solution Take 1 ampule by nebulization every 6 (six) hours as needed for wheezing or shortness of breath.     Marland Kitchen albuterol (VENTOLIN HFA) 108 (90 Base) MCG/ACT inhaler Inhale 2 puffs into the lungs every 6 (six) hours as needed for wheezing or shortness of breath. 8 g 2  . amLODipine-valsartan (EXFORGE) 5-160 MG tablet Take 1 tablet by mouth daily.    Marland Kitchen atorvastatin (LIPITOR) 10 MG tablet Take 10 mg by mouth daily.     . Fluticasone-Umeclidin-Vilant (TRELEGY ELLIPTA) 100-62.5-25 MCG/INH AEPB Inhale 1 Inhaler into the lungs in the morning.    . furosemide (LASIX) 40 MG tablet Take 1 tablet (40 mg total) by mouth daily. (Patient not taking: Reported on 10/31/2020) 14 tablet 0  . hydrOXYzine (ATARAX/VISTARIL) 10 MG tablet Take 10 mg by mouth at bedtime.     . metFORMIN (GLUCOPHAGE-XR) 500 MG 24 hr tablet Take 500 mg by mouth in the morning and at bedtime.    Marland Kitchen oxyCODONE (OXY IR/ROXICODONE) 5 MG immediate release tablet Take 1 tablet (5 mg total) by mouth every 6 (six) hours as needed for severe pain. 28 tablet 0   No current facility-administered medications for this visit.    Review of Systems  Constitutional: negative Eyes: negative Ears, nose, mouth, throat, and face: negative Respiratory: positive for pleurisy/chest pain Cardiovascular: negative Gastrointestinal: negative Genitourinary:negative Integument/breast: negative Hematologic/lymphatic: negative Musculoskeletal:negative Neurological:  negative Behavioral/Psych: negative Endocrine: negative Allergic/Immunologic: negative  Physical Exam  NWG:NFAOZ, healthy, no distress, well nourished and well developed SKIN: skin color, texture, turgor are normal, no rashes or significant lesions HEAD: Normocephalic, No masses, lesions, tenderness or abnormalities EYES: normal, PERRLA, Conjunctiva are pink and non-injected EARS: External ears normal, Canals clear OROPHARYNX:no exudate, no erythema and lips, buccal mucosa, and tongue normal  NECK: supple, no adenopathy, no JVD LYMPH:  no palpable lymphadenopathy, no hepatosplenomegaly LUNGS: clear to auscultation , and palpation HEART: regular rate & rhythm, no murmurs and no gallops ABDOMEN:abdomen soft, non-tender, obese, normal bowel sounds and no masses or organomegaly BACK: No CVA tenderness, Range of motion is normal EXTREMITIES:no joint deformities, effusion, or inflammation, no edema  NEURO: alert & oriented x 3 with fluent speech, no focal motor/sensory deficits  PERFORMANCE STATUS: ECOG 1  LABORATORY DATA: Lab Results  Component Value Date   WBC 9.7 11/20/2020   HGB 13.0 11/20/2020   HCT 39.6 11/20/2020   MCV 83.5 11/20/2020   PLT 232 11/20/2020      Chemistry      Component Value Date/Time   NA  140 11/20/2020 1250   K 4.5 11/20/2020 1250   CL 107 11/20/2020 1250   CO2 24 11/20/2020 1250   BUN 12 11/20/2020 1250   CREATININE 1.03 11/20/2020 1250      Component Value Date/Time   CALCIUM 9.4 11/20/2020 1250   ALKPHOS 84 11/20/2020 1250   AST 11 (L) 11/20/2020 1250   ALT 21 11/20/2020 1250   BILITOT 0.3 11/20/2020 1250       RADIOGRAPHIC STUDIES: DG Chest 2 View  Result Date: 10/31/2020 CLINICAL DATA:  Carcinoid tumor of right lung.  Recent surgery. EXAM: CHEST - 2 VIEW COMPARISON:  None. FINDINGS: Slight increase in a small to moderate right pleural effusion. No visible pneumothorax. Similar right-sided volume loss. Left lung is clear.  Cardiomediastinal silhouette is within normal limits and similar to prior. IMPRESSION: 1. Postsurgical changes on the right with mild volume loss. No visible pneumothorax. 2. Slight increase in a small to moderate right pleural effusion. Electronically Signed   By: Margaretha Sheffield MD   On: 10/31/2020 14:31    ASSESSMENT: This is a very pleasant 60 years old white male recently diagnosed with a stage IA (T1b, N0, M0) carcinoid tumor status post right lower lobe bibasilar segmentectomy under the care of Dr. Roxan Hockey on October 02, 2020.   PLAN: I had a lengthy discussion with the patient today about his current disease stage, prognosis and treatment options. I explained to the patient that he had curable intervention for his disease with the surgical resection. I explained to the patient that there is no survival benefit for any adjuvant chemotherapy or radiation for his early stage disease of carcinoid tumor. I recommended for the patient continuous observation with repeat CT scan of the chest in 6 months. The patient was advised to continue with the smoking cessation. He was advised to call immediately if he has any other concerning symptoms in the interval. The patient voices understanding of current disease status and treatment options and is in agreement with the current care plan.  All questions were answered. The patient knows to call the clinic with any problems, questions or concerns. We can certainly see the patient much sooner if necessary.  Thank you so much for allowing me to participate in the care of Jacob Orozco. I will continue to follow up the patient with you and assist in his care.  The total time spent in the appointment was 60 minutes.  Disclaimer: This note was dictated with voice recognition software. Similar sounding words can inadvertently be transcribed and may not be corrected upon review.   Eilleen Kempf November 20, 2020, 1:35 PM

## 2020-11-21 ENCOUNTER — Encounter: Payer: Self-pay | Admitting: Thoracic Surgery (Cardiothoracic Vascular Surgery)

## 2020-11-21 ENCOUNTER — Ambulatory Visit (INDEPENDENT_AMBULATORY_CARE_PROVIDER_SITE_OTHER): Payer: Self-pay | Admitting: Thoracic Surgery (Cardiothoracic Vascular Surgery)

## 2020-11-21 ENCOUNTER — Ambulatory Visit
Admission: RE | Admit: 2020-11-21 | Discharge: 2020-11-21 | Disposition: A | Discharge status: Home / Self Care | Payer: BC Managed Care – PPO | Source: Ambulatory Visit | Attending: Thoracic Surgery (Cardiothoracic Vascular Surgery) | Admitting: Thoracic Surgery (Cardiothoracic Vascular Surgery)

## 2020-11-21 VITALS — BP 129/74 | HR 79 | Resp 20 | Wt 272.0 lb

## 2020-11-21 DIAGNOSIS — C7A090 Malignant carcinoid tumor of the bronchus and lung: Secondary | ICD-10-CM

## 2020-11-21 DIAGNOSIS — Z902 Acquired absence of lung [part of]: Secondary | ICD-10-CM

## 2020-11-21 NOTE — Progress Notes (Signed)
[address]       [place] [address]             [phone]     HPI: Mr. Bilyeu returns for scheduled follow-up visit following a basilar segmentectomy for carcinoid tumor.   Ayrton Mcvay is a 60 year old man with a history of hypertension, obesity, asthma, type 2 diabetes, sleep apnea, and tobacco abuse.  He presented in July 2021 with shortness of breath and was diagnosed with pneumonia.  He was treated with antibiotics.  A CT showed a right lower lobe lung nodule.  This appeared to be a cylindrical nodule measuring 3.6 x 2.1 cm.  On PET CT there was low-grade activity.  It was suspicious for a carcinoid tumor with a postobstructive bronchiectasis.  I did a right lower lobe basilar segmentectomy on 10/02/2020.  The nodule turned out to be a 1.6 cm low-grade typical carcinoid tumor.  I last saw him in the office on 10/31/2020.  He was still having some incisional pain.  His shortness of breath was improving.  He says he still has some shortness of breath with exertion.  Is getting better but he is not back to his baseline.  He does have some soreness with his incision but is not taking any narcotics.  Past Medical History:  Diagnosis Date  . Asthma   . Diabetes mellitus without complication ([place])   . Hypertension   . Sleep apnea    cpap    Current Outpatient Medications  Medication Sig Dispense Refill  . acetaminophen (TYLENOL) 500 MG tablet Take 2 tablets (1,000 mg total) by mouth every 6 (six) hours as needed. 30 tablet 0  . albuterol (ACCUNEB) 0.63 MG/3ML nebulizer solution Take 1 ampule by nebulization every 6 (six) hours as needed for wheezing or shortness of breath.     Marland Kitchen albuterol (VENTOLIN HFA) 108 (90 Base) MCG/ACT inhaler Inhale 2 puffs into the lungs every 6 (six) hours as needed for wheezing or shortness of breath. 8 g 2  . amitriptyline (ELAVIL) 75 MG tablet Take 75 mg by mouth at bedtime.    Marland Kitchen amLODipine-valsartan (EXFORGE) 5-160 MG tablet  Take 1 tablet by mouth daily.    . Fluticasone-Umeclidin-Vilant (TRELEGY ELLIPTA) 100-62.5-25 MCG/INH AEPB Inhale 1 Inhaler into the lungs in the morning.    . hydrOXYzine (ATARAX/VISTARIL) 10 MG tablet Take 10 mg by mouth at bedtime.    Marland Kitchen LIVALO 4 MG TABS Take 1 tablet by mouth daily.    . metFORMIN (GLUCOPHAGE-XR) 500 MG 24 hr tablet Take 500 mg by mouth in the morning and at bedtime.    Marland Kitchen oxyCODONE (OXY IR/ROXICODONE) 5 MG immediate release tablet Take 1 tablet (5 mg total) by mouth every 6 (six) hours as needed for severe pain. 28 tablet 0   No current facility-administered medications for this visit.    Physical Exam BP 129/74 (BP Location: Right Arm, Patient Position: Sitting, Cuff Size: Large)   Pulse 79   Resp 20   Wt 272 lb (123.4 kg)   SpO2 98% Comment: RA  BMI 39.74 kg/m  60 year old man in no acute distress Obese Alert and oriented x3 with no focal deficits Incisions well-healed Lungs diminished breath sounds at right base but otherwise clear Cardiac regular rate and rhythm  Diagnostic Tests: CHEST - 2 VIEW  COMPARISON:  10/31/2020  FINDINGS: Cardiac shadow is within normal limits. Postsurgical changes are seen with small right-sided pleural effusion. Elevation of the right hemidiaphragm  is noted posteriorly. No new focal abnormality is seen.  IMPRESSION: Postsurgical change on the right with volume loss. No acute abnormality noted.   Electronically Signed   By: Inez Catalina M.D.   On: 11/21/2020 12:26 I personally reviewed the chest x-ray images and concur with the findings noted above.  Impression: Mieczyslaw Stamas is a 60 year old man who had a right lower lobe basilar segmentectomy for a stage Ia carcinoid tumor on 10/02/2020.  He is now about 6 weeks out from surgery.  He is no longer taking any narcotics.  He does still have some discomfort which should improve with time.  He does still have some shortness of breath with exertion.  That will  improve but he understands that we did resect most of his lower lobe and the will likely not return completely to his preoperative baseline.  Certainly losing weight and exercise on a regular basis will help that.  He is anxious to return to work.  He does have to walk a lot at work.  I recommended he wait a couple more weeks and try to go back on February 7.  He he will attempt that and then if he is not ready he will let us know.  He did see Dr. Julien Nordmann.  No adjuvant therapy is planned.  He will have a follow-up CT in 6 months.  Plan: Return in 2 months with PA lateral chest x-ray  Melrose Nakayama, MD [facility] [phone]

## 2020-11-22 ENCOUNTER — Telehealth: Payer: Self-pay | Admitting: Internal Medicine

## 2020-11-22 NOTE — Telephone Encounter (Signed)
Scheduled appointments per 1/24 los. Mailed updated calendar to patient.

## 2021-01-23 ENCOUNTER — Encounter: Payer: BC Managed Care – PPO | Admitting: Thoracic Surgery (Cardiothoracic Vascular Surgery)

## 2021-01-30 ENCOUNTER — Encounter: Payer: BC Managed Care – PPO | Admitting: Thoracic Surgery (Cardiothoracic Vascular Surgery)

## 2021-02-05 ENCOUNTER — Other Ambulatory Visit: Payer: Self-pay | Admitting: Thoracic Surgery (Cardiothoracic Vascular Surgery)

## 2021-02-05 DIAGNOSIS — C7A090 Malignant carcinoid tumor of the bronchus and lung: Secondary | ICD-10-CM

## 2021-02-06 ENCOUNTER — Other Ambulatory Visit: Payer: Self-pay

## 2021-02-06 ENCOUNTER — Ambulatory Visit
Admission: RE | Admit: 2021-02-06 | Discharge: 2021-02-06 | Disposition: A | Discharge status: Home / Self Care | Payer: BC Managed Care – PPO | Source: Ambulatory Visit | Attending: Thoracic Surgery (Cardiothoracic Vascular Surgery) | Admitting: Thoracic Surgery (Cardiothoracic Vascular Surgery)

## 2021-02-06 ENCOUNTER — Encounter: Payer: Self-pay | Admitting: Thoracic Surgery (Cardiothoracic Vascular Surgery)

## 2021-02-06 ENCOUNTER — Ambulatory Visit (INDEPENDENT_AMBULATORY_CARE_PROVIDER_SITE_OTHER): Payer: BC Managed Care – PPO | Admitting: Thoracic Surgery (Cardiothoracic Vascular Surgery)

## 2021-02-06 VITALS — BP 131/73 | HR 85 | Resp 20 | Ht 70.0 in | Wt 274.0 lb

## 2021-02-06 DIAGNOSIS — I471 Supraventricular tachycardia, unspecified: Secondary | ICD-10-CM | POA: Insufficient documentation

## 2021-02-06 DIAGNOSIS — Z902 Acquired absence of lung [part of]: Secondary | ICD-10-CM

## 2021-02-06 DIAGNOSIS — C7A090 Malignant carcinoid tumor of the bronchus and lung: Secondary | ICD-10-CM

## 2021-02-06 NOTE — Progress Notes (Signed)
HPI: Jacob Orozco returns for follow-up after a basilar segmentectomy  Jacob Orozco is a 60 year old man with a history of A2.  He hypertension, obesity, asthma, type 2 diabetes, sleep apnea, tobacco abuse, and a carcinoid tumor of the right lower lobe.  He presented in July 2021 with shortness of breath.  He was diagnosed with pneumonia.  CT showed a right lower lobe lung nodule.  On PET CT there was low-grade activity.  The radiologist read as suspicious for carcinoid tumor with postobstructive bronchiectasis.  I did a right lower lobe basilar segmentectomy on 10/02/2020.  The nodule turned out to be a 1.6 cm low-grade typical carcinoid tumor.  I last saw him in the office on 11/21/2020.  He was having some shortness of breath and was anxious to get back to work.  He was not taking any narcotics.  In the interim since his last visit he had an episode of supraventricular tachycardia in March.  He was seen and treated in [place].  He ruled out for MI.  He has not had any further episodes since then.  His respiratory capacity is improving.  He still will have an occasional sharp shooting pain in the area of his anteriormost incision.  That happens about 2-3 times a week.  He does not take anything for that.  Past Medical History:  Diagnosis Date  . Asthma   . Diabetes mellitus without complication ([place])   . Hypertension   . Sleep apnea    cpap    Current Outpatient Medications  Medication Sig Dispense Refill  . acetaminophen (TYLENOL) 500 MG tablet Take 2 tablets (1,000 mg total) by mouth every 6 (six) hours as needed. 30 tablet 0  . albuterol (ACCUNEB) 0.63 MG/3ML nebulizer solution Take 1 ampule by nebulization every 6 (six) hours as needed for wheezing or shortness of breath.     Marland Kitchen albuterol (VENTOLIN HFA) 108 (90 Base) MCG/ACT inhaler Inhale 2 puffs into the lungs every 6 (six) hours as needed for wheezing or shortness of breath. 8 g 2  . amitriptyline (ELAVIL) 75 MG tablet Take 75 mg  by mouth at bedtime.    Marland Kitchen amLODipine-valsartan (EXFORGE) 5-160 MG tablet Take 1 tablet by mouth daily.    . Fluticasone-Umeclidin-Vilant (TRELEGY ELLIPTA) 100-62.5-25 MCG/INH AEPB Inhale 1 Inhaler into the lungs in the morning.    Marland Kitchen LIVALO 4 MG TABS Take 1 tablet by mouth daily.    . metFORMIN (GLUCOPHAGE-XR) 500 MG 24 hr tablet Take 500 mg by mouth in the morning and at bedtime.    . metoprolol tartrate (LOPRESSOR) 50 MG tablet Take 50 mg by mouth 2 (two) times daily.     No current facility-administered medications for this visit.    Physical Exam BP 131/73 (BP Location: Right Arm, Patient Position: Sitting)   Pulse 85   Resp 20   Ht 5\' 10"  (1.778 m)   Wt 274 lb (124.3 kg)   SpO2 92% Comment: RA  BMI 39.31 kg/m  Obese 60 year old man in no acute distress Alert and oriented x3 with no focal deficits Lungs diminished at right base, otherwise clear Cardiac regular rate and rhythm normal S1 and S2 Incisions well-healed  Diagnostic Tests: Chest x-ray has not yet been read.  I reviewed the films.  There is a slight decrease in the size of the right pleural effusion from prior film.  Impression: Jacob Orozco is a 60 year old man who had a right lower lobe basilar segmentectomy for a stage Ia (T1, N0)  low-grade typical carcinoid tumor in December 2021.  From a surgical standpoint is doing well.  He does have an occasional shooting pain associated with his incision.  That has decreased over time but has not stopped completely.  I offered him a trial of gabapentin or Lyrica to see if that would help.  He does not want to use those medications.  It does not bother him much and he just wanted to make sure there was not any underlying problem.  Tobacco abuse-quit smoking in August 2021.  Has not restarted.  Congratulated him for that.  Plan: Follow-up with Dr. Julien Nordmann as scheduled in July I will see him back in August to check on his progress.  I do not need to do an x-ray I can just  reviewed the CT scan  Melrose Nakayama, MD [facility] [phone]

## 2021-05-02 ENCOUNTER — Telehealth: Payer: Self-pay | Admitting: Internal Medicine

## 2021-05-02 NOTE — Telephone Encounter (Signed)
R/s appts per 6/23 sch msg. Pt's daughter is aware.

## 2021-05-18 ENCOUNTER — Other Ambulatory Visit: Payer: BC Managed Care – PPO

## 2021-05-21 ENCOUNTER — Ambulatory Visit: Payer: BC Managed Care – PPO | Admitting: Internal Medicine

## 2021-06-04 ENCOUNTER — Inpatient Hospital Stay: Payer: BC Managed Care – PPO | Attending: Internal Medicine

## 2021-06-04 ENCOUNTER — Ambulatory Visit (HOSPITAL_COMMUNITY)
Admission: RE | Admit: 2021-06-04 | Discharge: 2021-06-04 | Disposition: A | Discharge status: Home / Self Care | Payer: BC Managed Care – PPO | Source: Ambulatory Visit | Attending: Internal Medicine | Admitting: Internal Medicine

## 2021-06-04 ENCOUNTER — Other Ambulatory Visit: Payer: Self-pay

## 2021-06-04 DIAGNOSIS — Z85118 Personal history of other malignant neoplasm of bronchus and lung: Secondary | ICD-10-CM | POA: Diagnosis present

## 2021-06-04 DIAGNOSIS — C3490 Malignant neoplasm of unspecified part of unspecified bronchus or lung: Secondary | ICD-10-CM

## 2021-06-04 LAB — CBC WITH DIFFERENTIAL (CANCER CENTER ONLY)
Abs Immature Granulocytes: 0.05 10*3/uL (ref 0.00–0.07)
Basophils Absolute: 0.1 10*3/uL (ref 0.0–0.1)
Basophils Relative: 1 %
Eosinophils Absolute: 0.3 10*3/uL (ref 0.0–0.5)
Eosinophils Relative: 3 %
HCT: 41.3 % (ref 39.0–52.0)
Hemoglobin: 14.1 g/dL (ref 13.0–17.0)
Immature Granulocytes: 1 %
Lymphocytes Relative: 28 %
Lymphs Abs: 2.8 10*3/uL (ref 0.7–4.0)
MCH: 28.6 pg (ref 26.0–34.0)
MCHC: 34.1 g/dL (ref 30.0–36.0)
MCV: 83.8 fL (ref 80.0–100.0)
Monocytes Absolute: 0.8 10*3/uL (ref 0.1–1.0)
Monocytes Relative: 8 %
Neutro Abs: 5.9 10*3/uL (ref 1.7–7.7)
Neutrophils Relative %: 59 %
Platelet Count: 173 10*3/uL (ref 150–400)
RBC: 4.93 MIL/uL (ref 4.22–5.81)
RDW: 14.6 % (ref 11.5–15.5)
WBC Count: 9.9 10*3/uL (ref 4.0–10.5)
nRBC: 0 % (ref 0.0–0.2)

## 2021-06-04 LAB — CMP (CANCER CENTER ONLY)
ALT: 32 U/L (ref 0–44)
AST: 17 U/L (ref 15–41)
Albumin: 3.4 g/dL — ABNORMAL LOW (ref 3.5–5.0)
Alkaline Phosphatase: 99 U/L (ref 38–126)
Anion gap: 11 (ref 5–15)
BUN: 19 mg/dL (ref 6–20)
CO2: 22 mmol/L (ref 22–32)
Calcium: 9.6 mg/dL (ref 8.9–10.3)
Chloride: 103 mmol/L (ref 98–111)
Creatinine: 1.23 mg/dL (ref 0.61–1.24)
GFR, Estimated: >60 mL/min (ref >60)
Glucose, Bld: 335 mg/dL — ABNORMAL HIGH (ref 70–99)
Potassium: 4.6 mmol/L (ref 3.5–5.1)
Sodium: 136 mmol/L (ref 135–145)
Total Bilirubin: 0.2 mg/dL — ABNORMAL LOW (ref 0.3–1.2)
Total Protein: 7.1 g/dL (ref 6.5–8.1)

## 2021-06-04 MED ORDER — IOHEXOL 350 MG/ML SOLN
75.0000 mL | Freq: Once | INTRAVENOUS | Status: AC | PRN
  Administered 2021-06-04: 75 mL via INTRAVENOUS

## 2021-06-05 ENCOUNTER — Encounter: Payer: Self-pay | Admitting: Thoracic Surgery (Cardiothoracic Vascular Surgery)

## 2021-06-05 ENCOUNTER — Inpatient Hospital Stay (HOSPITAL_BASED_OUTPATIENT_CLINIC_OR_DEPARTMENT_OTHER): Payer: BC Managed Care – PPO | Admitting: Internal Medicine

## 2021-06-05 ENCOUNTER — Encounter: Payer: Self-pay | Admitting: Internal Medicine

## 2021-06-05 ENCOUNTER — Ambulatory Visit (INDEPENDENT_AMBULATORY_CARE_PROVIDER_SITE_OTHER): Payer: BC Managed Care – PPO | Admitting: Thoracic Surgery (Cardiothoracic Vascular Surgery)

## 2021-06-05 VITALS — BP 154/78 | HR 106 | Temp 97.9°F | Resp 18 | Wt 275.5 lb

## 2021-06-05 VITALS — BP 140/80 | HR 100 | Resp 20 | Ht 70.0 in | Wt 274.0 lb

## 2021-06-05 DIAGNOSIS — C7A090 Malignant carcinoid tumor of the bronchus and lung: Secondary | ICD-10-CM

## 2021-06-05 DIAGNOSIS — Z09 Encounter for follow-up examination after completed treatment for conditions other than malignant neoplasm: Secondary | ICD-10-CM | POA: Diagnosis not present

## 2021-06-05 DIAGNOSIS — D3A090 Benign carcinoid tumor of the bronchus and lung: Secondary | ICD-10-CM

## 2021-06-05 DIAGNOSIS — C3490 Malignant neoplasm of unspecified part of unspecified bronchus or lung: Secondary | ICD-10-CM | POA: Diagnosis not present

## 2021-06-05 DIAGNOSIS — Z85118 Personal history of other malignant neoplasm of bronchus and lung: Secondary | ICD-10-CM | POA: Diagnosis not present

## 2021-06-05 NOTE — Progress Notes (Signed)
[address]       [place] [address]             [phone]      HPI: [name] returns for a scheduled follow-up visit  Jacob Orozco is a 60 year old male with a history of obesity, obstructive sleep apnea, hypertension, asthma, type 2 diabetes, tobacco abuse, and a carcinoid tumor of the right lower lobe.  He presented with shortness of breath in July 2021.  He was diagnosed with pneumonia and was found to have a right lower lobe lung nodule.  There is low-grade activity on PET CT.  Findings were consistent with a carcinoid tumor with postobstructive bronchiectasis.  I did a right lower lobe basilar segmentectomy on 10/02/2020.  The nodule turned out to be a 1.6 cm low-grade typical carcinoid tumor.  He did well with the surgery.  I last saw him in April.  He had 1 episode of supraventricular tachycardia in March.  Since then he had 1 more episode where he had similar symptoms but it resolved by the time he got home so he did not seek any further medical care.  Other than that he is feeling well.  He is not having any significant incisional pain.  Past Medical History:  Diagnosis Date   Asthma    Diabetes mellitus without complication ([place])    Hypertension    Sleep apnea    cpap    Current Outpatient Medications  Medication Sig Dispense Refill   acetaminophen (TYLENOL) 500 MG tablet Take 2 tablets (1,000 mg total) by mouth every 6 (six) hours as needed. 30 tablet 0   albuterol (ACCUNEB) 0.63 MG/3ML nebulizer solution Take 1 ampule by nebulization every 6 (six) hours as needed for wheezing or shortness of breath.      albuterol (VENTOLIN HFA) 108 (90 Base) MCG/ACT inhaler Inhale 2 puffs into the lungs every 6 (six) hours as needed for wheezing or shortness of breath. 8 g 2   amitriptyline (ELAVIL) 75 MG tablet Take 75 mg by mouth at bedtime.     amLODipine-valsartan (EXFORGE) 5-160 MG tablet Take 1 tablet by mouth daily.     Fluticasone-Umeclidin-Vilant  (TRELEGY ELLIPTA) 100-62.5-25 MCG/INH AEPB Inhale 1 Inhaler into the lungs in the morning.     LIVALO 4 MG TABS Take 1 tablet by mouth daily.     metFORMIN (GLUCOPHAGE-XR) 500 MG 24 hr tablet Take 500 mg by mouth in the morning and at bedtime.     metoprolol tartrate (LOPRESSOR) 50 MG tablet Take 50 mg by mouth 2 (two) times daily.     No current facility-administered medications for this visit.    Physical Exam BP 140/80   Pulse 100   Resp 20   Ht 5\' 10"  (1.778 m)   Wt 274 lb (124.3 kg)   SpO2 96% Comment: RA  BMI 39.80 kg/m  60 year old man in no acute distress Alert and oriented x3 with no focal deficits Lungs slightly diminished at right base but otherwise clear Incisions well-healed Cardiac regular rate and rhythm  Diagnostic Tests: CT CHEST WITH CONTRAST   TECHNIQUE: Multidetector CT imaging of the chest was performed during intravenous contrast administration.   CONTRAST:  6mL OMNIPAQUE IOHEXOL 350 MG/ML SOLN   COMPARISON:  CT abdomen pelvis, 03/17/2014   FINDINGS: Cardiovascular: Aortic atherosclerosis. Normal heart size. Three-vessel coronary artery calcifications. No pericardial effusion.   Mediastinum/Nodes: No enlarged mediastinal, hilar, or axillary lymph nodes. Thyroid gland, trachea, and esophagus  demonstrate no significant findings.   Lungs/Pleura: Background of very fine centrilobular nodularity, most concentrated at the lung apices. Evidence of prior right lower lobe wedge resection with a small, chronic, loculated appearing right pleural effusion. No pleural effusion or pneumothorax.   Upper Abdomen: No acute abnormality. Hepatic steatosis. There are fatty attenuation, definitively benign bilateral adrenal adenomata which are stable compared to remote prior examination of the abdomen and pelvis dated 03/17/2014.   Musculoskeletal: No chest wall mass or suspicious bone lesions identified.   IMPRESSION: 1. Evidence of prior right lower lobe  wedge resection with a small, chronic, loculated appearing right pleural effusion. No evidence of malignant recurrence or metastatic disease in the chest on this examination, for which no preoperative comparison imaging is available. 2. Background of very fine centrilobular nodularity, most concentrated at the lung apices, nonspecific and infectious or inflammatory, most commonly seen in smoking-related respiratory bronchiolitis. 3. Coronary artery disease. 4. Hepatic steatosis.   Aortic Atherosclerosis (ICD10-I70.0).     Electronically Signed   By: Eddie Candle M.D.   On: 06/05/2021 10:51   I personally reviewed the CT images.  Postoperative changes present.  No findings suspicious for recurrence.  He does have some coronary calcification.  Impression: Jacob Orozco is a 60 year old man with a history of obesity, obstructive sleep apnea, asthma, tobacco abuse, carcinoid tumor of the lung, type 2 diabetes, and hypertension.  Typical carcinoid tumor right lower lobe, stage Ia.  Status post right lower lobe basilar segmentectomy in December 2021.  CT shows no evidence of recurrent disease.  We will continue to be followed by Dr. Julien Nordmann.  Tobacco abuse-quit smoking in August 2021.  I congratulated him on going a year without smoking and emphasized the importance of continued abstinence.  Coronary calcification seen on CT-no anginal symptoms.  Doing well from a surgical standpoint.  No pain issues.  Plan: Follow-up with Dr. Julien Nordmann later today. At this point I think he can safely follow-up with Dr. Julien Nordmann.  I will be available if I can be of any further assistance in his care going forward.  Melrose Nakayama, MD [facility] [phone]

## 2021-06-05 NOTE — Progress Notes (Signed)
[facility] Telephone:([phone]   Fax:(336) [phone]  OFFICE PROGRESS NOTE  Andres Shad, MD [address] 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  PRIOR THERAPY: Status post right lower lobe bibasilar segmentectomy under the care of Dr. Roxan Hockey on October 02, 2020.  CURRENT THERAPY: Observation.  INTERVAL HISTORY: Jacob Orozco 60 y.o. male returns to the clinic today for 64-month follow-up visit.  The patient is feeling fine today with no concerning complaints.  He has 1 episode of SVT recently and he has it on EKG strip but this resolved spontaneously.  He is followed by cardiology.  He denied having any current chest pain, shortness of breath, cough or hemoptysis.  He denied having any fever or chills.  He has no nausea, vomiting, diarrhea or constipation.  He has no headache or visual changes.  The patient had repeat CT scan of the chest performed recently and he is here for evaluation and discussion of his discuss results.  MEDICAL HISTORY: Past Medical History:  Diagnosis Date   Asthma    Diabetes mellitus without complication ([place])    Hypertension    Sleep apnea    cpap    ALLERGIES:  is allergic to penicillins and codeine.  MEDICATIONS:  Current Outpatient Medications  Medication Sig Dispense Refill   acetaminophen (TYLENOL) 500 MG tablet Take 2 tablets (1,000 mg total) by mouth every 6 (six) hours as needed. 30 tablet 0   albuterol (ACCUNEB) 0.63 MG/3ML nebulizer solution Take 1 ampule by nebulization every 6 (six) hours as needed for wheezing or shortness of breath.      albuterol (VENTOLIN HFA) 108 (90 Base) MCG/ACT inhaler Inhale 2 puffs into the lungs every 6 (six) hours as needed for wheezing or shortness of breath. 8 g 2   amitriptyline (ELAVIL) 75 MG tablet Take 75 mg by mouth at bedtime.     amLODipine-valsartan (EXFORGE) 5-160 MG tablet Take 1 tablet by mouth  daily.     Fluticasone-Umeclidin-Vilant (TRELEGY ELLIPTA) 100-62.5-25 MCG/INH AEPB Inhale 1 Inhaler into the lungs in the morning.     LIVALO 4 MG TABS Take 1 tablet by mouth daily.     metFORMIN (GLUCOPHAGE-XR) 500 MG 24 hr tablet Take 500 mg by mouth in the morning and at bedtime.     metoprolol tartrate (LOPRESSOR) 50 MG tablet Take 50 mg by mouth 2 (two) times daily.     No current facility-administered medications for this visit.    SURGICAL HISTORY:  Past Surgical History:  Procedure Laterality Date   APPENDECTOMY     CHOLECYSTECTOMY     INTERCOSTAL NERVE BLOCK Right 10/02/2020   Procedure: INTERCOSTAL NERVE BLOCK;  Surgeon: Melrose Nakayama, MD;  Location: [facility];  Service: Thoracic;  Laterality: Right;   LUNG SEGMENTECTOMY Right 10/02/2020   XI ROBOTIC ASSISTED THORASCOPY-RIGHT LOWER    thorascopy Right 10/02/2020   XI ROBOTIC ASSISTED THORASCOPY-RIGHT LOWER    TONSILLECTOMY      REVIEW OF SYSTEMS:  A comprehensive review of systems was negative except for: Respiratory: positive for dyspnea on exertion   PHYSICAL EXAMINATION: General appearance: alert, cooperative, no distress, and moderately obese Head: Normocephalic, without obvious abnormality, atraumatic Neck: no adenopathy, no JVD, supple, symmetrical, trachea midline, and thyroid not enlarged, symmetric, no tenderness/mass/nodules Lymph nodes: Cervical, supraclavicular, and axillary nodes normal. Resp: clear to auscultation bilaterally Back: symmetric, no curvature. ROM normal. No CVA tenderness. Cardio:  regular rate and rhythm, S1, S2 normal, no murmur, click, rub or gallop GI: soft, non-tender; bowel sounds normal; no masses,  no organomegaly Extremities: extremities normal, atraumatic, no cyanosis or edema  ECOG PERFORMANCE STATUS: 1 - Symptomatic but completely ambulatory  Blood pressure (!) 154/78, pulse (!) 106, temperature 97.9 F (36.6 C), temperature source Oral, resp. rate 18, weight 275 lb 8 oz (125  kg), SpO2 99 %.  LABORATORY DATA: Lab Results  Component Value Date   WBC 9.9 06/04/2021   HGB 14.1 06/04/2021   HCT 41.3 06/04/2021   MCV 83.8 06/04/2021   PLT 173 06/04/2021      Chemistry      Component Value Date/Time   NA 136 06/04/2021 0930   K 4.6 06/04/2021 0930   CL 103 06/04/2021 0930   CO2 22 06/04/2021 0930   BUN 19 06/04/2021 0930   CREATININE 1.23 06/04/2021 0930      Component Value Date/Time   CALCIUM 9.6 06/04/2021 0930   ALKPHOS 99 06/04/2021 0930   AST 17 06/04/2021 0930   ALT 32 06/04/2021 0930   BILITOT 0.2 (L) 06/04/2021 0930       RADIOGRAPHIC STUDIES: CT Chest W Contrast  Result Date: 06/05/2021 CLINICAL DATA:  Right lower lobe carcinoid tumor, status post resection EXAM: CT CHEST WITH CONTRAST TECHNIQUE: Multidetector CT imaging of the chest was performed during intravenous contrast administration. CONTRAST:  81mL OMNIPAQUE IOHEXOL 350 MG/ML SOLN COMPARISON:  CT abdomen pelvis, 03/17/2014 FINDINGS: Cardiovascular: Aortic atherosclerosis. Normal heart size. Three-vessel coronary artery calcifications. No pericardial effusion. Mediastinum/Nodes: No enlarged mediastinal, hilar, or axillary lymph nodes. Thyroid gland, trachea, and esophagus demonstrate no significant findings. Lungs/Pleura: Background of very fine centrilobular nodularity, most concentrated at the lung apices. Evidence of prior right lower lobe wedge resection with a small, chronic, loculated appearing right pleural effusion. No pleural effusion or pneumothorax. Upper Abdomen: No acute abnormality. Hepatic steatosis. There are fatty attenuation, definitively benign bilateral adrenal adenomata which are stable compared to remote prior examination of the abdomen and pelvis dated 03/17/2014. Musculoskeletal: No chest wall mass or suspicious bone lesions identified. IMPRESSION: 1. Evidence of prior right lower lobe wedge resection with a small, chronic, loculated appearing right pleural effusion. No  evidence of malignant recurrence or metastatic disease in the chest on this examination, for which no preoperative comparison imaging is available. 2. Background of very fine centrilobular nodularity, most concentrated at the lung apices, nonspecific and infectious or inflammatory, most commonly seen in smoking-related respiratory bronchiolitis. 3. Coronary artery disease. 4. Hepatic steatosis. Aortic Atherosclerosis (ICD10-I70.0). Electronically Signed   By: Eddie Candle M.D.   On: 06/05/2021 10:51    ASSESSMENT AND PLAN: This is a very pleasant 60 years old white male with a stage IA (T1b, N0, M0) carcinoid tumor diagnosed in December 2021 status post right lower lobe bibasilar segmentectomy under the care of Dr. Roxan Hockey on October 02, 2020. The patient is currently on observation and he is feeling fine today with no concerning complaints except for shortness of breath with exertion secondary to his body habitus and obesity.  He still works as Public relations account executive. The patient had repeat CT scan of the chest performed recently.  I personally and independently reviewed the scans and discussed the results with the patient today. His scan showed no concerning findings for disease recurrence or metastasis. I recommended for him to continue on observation with repeat CT scan of the chest in 1 year. The patient was advised to call immediately if he  has any other concerning symptoms in the interval. The patient voices understanding of current disease status and treatment options and is in agreement with the current care plan.  All questions were answered. The patient knows to call the clinic with any problems, questions or concerns. We can certainly see the patient much sooner if necessary.  Disclaimer: This note was dictated with voice recognition software. Similar sounding words can inadvertently be transcribed and may not be corrected upon review.

## 2021-06-08 ENCOUNTER — Telehealth: Payer: Self-pay | Admitting: Internal Medicine

## 2021-06-08 NOTE — Telephone Encounter (Signed)
Scheduled appointment per 08/09 los. Left message.

## 2021-11-22 NOTE — Progress Notes (Signed)
Cardiology Office Note   Date:  11/22/2021   ID:  Jacob Orozco, DOB 20-Jun-1961, MRN [id number]  PCP:  Andres Shad, MD  Cardiologist:   None Referring:  Andres Shad, MD  No chief complaint on file.     History of Present Illness: Jacob Orozco is a 61 y.o. male who presents for evaluation of supraventricular tachycardia.  He has had recurrent episodes of this.  He is referred by Andres Shad, MD. He gives a detailed history.  On March 30 he was at work when he felt his heart racing.  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  It was 216.  He felt some chest and neck discomfort and some numbness and tingling.  EMS was called and he was treated with adenosine.  He was seen by cardiology Dr. Sondra Come in [place].  He was treated with 50 mg of metoprolol.  He did okay until December 29.  At work again per tickly doing anything his heart rate was 220.  EMS again was called gave him adenosine.  He was treated with 100 mg of metoprolol.  On January 11 he had another episode of rapid rate.  He had tried vagal maneuvers .  At that time he had a wide-complex tachycardia that he was the same rate as previous.  He was actually treated with amiodarone.  When he got to the emergency room he did have a adenosine and he broke to a narrow complex.  There was some conduction abnormality immediately after the adenosine with a left bundle branch block.  However, he is otherwise had a normal baseline EKG.  His amiodarone was eventually increased to 200 mg.  However, on the 14th he had significant bradycardia and felt very dizzy.  He again went to the emergency room.  Troponins were drawn that time as they had not been drawn the other times.  He tells me they were normal.  I do not see these results.  None of these occasions that he need hospitalization.  He on his own.  His metoprolol 200 mg in the morning and 50 mg in the evening.  He otherwise is felt  well.  He has not had any prior cardiac work-up other than a stress perfusion study for chest discomfort in 2019.  He did have an echocardiogram done by Dr. Sondra Come during this evaluation and I reviewed those results.  This was normal.  Labs have been unremarkable.   Past Medical History:  Diagnosis Date   Asthma    Diabetes mellitus without complication ([place])    Hypertension    Sleep apnea    cpap    Past Surgical History:  Procedure Laterality Date   APPENDECTOMY     CHOLECYSTECTOMY     INTERCOSTAL NERVE BLOCK Right 10/02/2020   Procedure: INTERCOSTAL NERVE BLOCK;  Surgeon: Melrose Nakayama, MD;  Location: [facility];  Service: Thoracic;  Laterality: Right;   LUNG SEGMENTECTOMY Right 10/02/2020   XI ROBOTIC ASSISTED THORASCOPY-RIGHT LOWER    thorascopy Right 10/02/2020   XI ROBOTIC ASSISTED THORASCOPY-RIGHT LOWER    TONSILLECTOMY       Current Outpatient Medications  Medication Sig Dispense Refill   acetaminophen (TYLENOL) 500 MG tablet Take 2 tablets (1,000 mg total) by mouth every 6 (six) hours as needed. 30 tablet 0   albuterol (ACCUNEB) 0.63 MG/3ML nebulizer solution Take 1 ampule by nebulization every 6 (six) hours as needed  for wheezing or shortness of breath.      albuterol (VENTOLIN HFA) 108 (90 Base) MCG/ACT inhaler Inhale 2 puffs into the lungs every 6 (six) hours as needed for wheezing or shortness of breath. 8 g 2   amitriptyline (ELAVIL) 75 MG tablet Take 75 mg by mouth at bedtime.     amLODipine-valsartan (EXFORGE) 5-160 MG tablet Take 1 tablet by mouth daily.     Fluticasone-Umeclidin-Vilant (TRELEGY ELLIPTA) 100-62.5-25 MCG/INH AEPB Inhale 1 Inhaler into the lungs in the morning.     LIVALO 4 MG TABS Take 1 tablet by mouth daily.     metFORMIN (GLUCOPHAGE-XR) 500 MG 24 hr tablet Take 500 mg by mouth in the morning and at bedtime.     metoprolol tartrate (LOPRESSOR) 50 MG tablet Take 50 mg by mouth 2 (two) times daily.     No current facility-administered  medications for this visit.    Allergies:   Penicillins and Codeine    Social History:  The patient  reports that he quit smoking about 17 months ago. His smoking use included cigarettes. He smoked an average of .5 packs per day. He has never used smokeless tobacco. He reports that he does not drink alcohol and does not use drugs.   Family History:  The patient's family history is not on file.    ROS:  Please see the history of present illness.   Otherwise, review of systems are positive for none.   All other systems are reviewed and negative.    PHYSICAL EXAM: VS:  There were no vitals taken for this visit. , BMI There is no height or weight on file to calculate BMI. GENERAL:  Well appearing HEENT:  Pupils equal round and reactive, fundi not visualized, oral mucosa unremarkable NECK:  No jugular venous distention, waveform within normal limits, carotid upstroke brisk and symmetric, no bruits, no thyromegaly LYMPHATICS:  No cervical, inguinal adenopathy LUNGS:  Clear to auscultation bilaterally BACK:  No CVA tenderness CHEST:  Unremarkable HEART:  PMI not displaced or sustained,S1 and S2 within normal limits, no S3, no S4, no clicks, no rubs, no murmurs ABD:  Flat, positive bowel sounds normal in frequency in pitch, no bruits, no rebound, no guarding, no midline pulsatile mass, no hepatomegaly, no splenomegaly EXT:  2 plus pulses throughout, no edema, no cyanosis no clubbing SKIN:  No rashes no nodules NEURO:  Cranial nerves II through XII grossly intact, motor grossly intact throughout PSYCH:  Cognitively intact, oriented to person place and time    EKG:  EKG is ordered today. The ekg ordered today demonstrates sinus rhythm, rate 71, axis within normal limits, intervals within normal limits, no acute ST-T wave changes.   Recent Labs: 06/04/2021: ALT 32; BUN 19; Creatinine 1.23; Hemoglobin 14.1; Platelet Count 173; Potassium 4.6; Sodium 136    Lipid Panel No results found for:  CHOL, TRIG, HDL, CHOLHDL, VLDL, LDLCALC, LDLDIRECT    Wt Readings from Last 3 Encounters:  06/05/21 275 lb 8 oz (125 kg)  06/05/21 274 lb (124.3 kg)  02/06/21 274 lb (124.3 kg)      Other studies Reviewed: Additional studies/ records that were reviewed today include: Extensive review of outside records   (Greater than 55 minutes reviewing all data with greater than 50% face to face with the patient) . Review of the above records demonstrates:  Please see elsewhere in the note.     ASSESSMENT AND PLAN:  TACHYCARDIA: The patient has had recurrent episodes of SVT.  The wide-complex tachycardia likely represents SVT as well.  He has now had to call EMS multiple times in the last few months.  He has had multiple ER visits.  SVT ablation would be indicated and he would be interested in this.  I did call and speak with Dr. Curt Bears and he will go ahead and arrange a video visit to discuss the timing of this.  The patient is interested in doing this sooner rather than later but has a tight work schedule.  For now he will continue the meds as listed.    SLEEP APNEA: He does use CPAP.   DM: His A1c is not controlled but he is starting to work on this and it is coming down.  I will defer to his Andres Shad, MD   Current medicines are reviewed at length with the patient today.  The patient does not have concerns regarding medicines.  The following changes have been made:  no change  Labs/ tests ordered today include: None No orders of the defined types were placed in this encounter.    Disposition:   FU with Dr. Curt Bears.      Signed, Minus Breeding, MD  11/22/2021 5:16 PM    [place]

## 2021-11-23 ENCOUNTER — Ambulatory Visit (INDEPENDENT_AMBULATORY_CARE_PROVIDER_SITE_OTHER): Payer: BC Managed Care – PPO | Admitting: Cardiology

## 2021-11-23 ENCOUNTER — Other Ambulatory Visit: Payer: Self-pay

## 2021-11-23 ENCOUNTER — Encounter: Payer: Self-pay | Admitting: Cardiology

## 2021-11-23 VITALS — BP 128/70 | HR 71 | Ht 70.0 in | Wt 267.4 lb

## 2021-11-23 DIAGNOSIS — I471 Supraventricular tachycardia: Secondary | ICD-10-CM

## 2021-11-23 NOTE — Patient Instructions (Signed)
Medication Instructions:  Your physician recommends that you continue on your current medications as directed. Please refer to the Current Medication list given to you today.   Labwork: NONE  Testing/Procedures: NONE  Follow-Up: THE OFFICE WILL CALL YOU WITH APPOINTMENT WITH ELECTROPHYSIOLOGIST FOR HOPEFULLY A VIDEO VISIT TO DISCUSS ABLATION

## 2021-12-03 ENCOUNTER — Telehealth: Payer: Self-pay | Admitting: Cardiology

## 2021-12-03 NOTE — Telephone Encounter (Signed)
Spoke with pt regarding recent office visit with Dr. Percival Spanish. Per Dr. Rosezella [place] note he made a referral to EP to discuss ablation. Pt states that he spoke to a scheduler that says he can't get in to be seen until April. Scheduler then told pt that another EP doctor was available in March. Pt states that he was under the impression that Dr. Percival Spanish wanted him to be seen sooner than later. Will route message to Dr. Percival Spanish to advise. Pt verbalizes understanding.

## 2021-12-03 NOTE — Telephone Encounter (Signed)
New Message:   Patient said he would like to know what is the delay for getting him scheduled for his Ablation. He said he though Dr Percival Spanish was going to get this asap.

## 2021-12-04 ENCOUNTER — Other Ambulatory Visit: Payer: Self-pay | Admitting: *Deleted

## 2021-12-04 DIAGNOSIS — I471 Supraventricular tachycardia: Secondary | ICD-10-CM

## 2021-12-04 NOTE — Telephone Encounter (Signed)
Patient's daughter calling back. She requests that when the patient is scheduled that she also get a call back.

## 2021-12-04 NOTE — Telephone Encounter (Signed)
Follow up scheduled for dr Curt Bears.

## 2021-12-06 NOTE — Telephone Encounter (Signed)
Dr. Curt Bears this pt is scheduled for mychart video visit on 2/28 --- discuss SVT ablation. Are you ok with mychart video visit consult?   Please advise, thanks

## 2021-12-20 ENCOUNTER — Telehealth: Payer: Self-pay | Admitting: Cardiology

## 2021-12-20 ENCOUNTER — Encounter: Payer: Self-pay | Admitting: Cardiology

## 2021-12-20 NOTE — Telephone Encounter (Signed)
Patient's daughter Raquel Sarna would like to speak to the nurse.  She sent a message through [facility].

## 2021-12-20 NOTE — Telephone Encounter (Signed)
Call the daughter daughter was very demandant that her father be schedule for Ablation .  Per daughter , patient had an episode last evening and she sent copy of strips through [employer]. Daughter states the medical person ( "doctor") told her last night that it was imperative the the patient have toe procedure right away.   RN  informed daughter her father will need to see EP, that only Dr Curt Bears can schedule the procedure and that the message would be sent to Dr Curt Bears and team  to see if the appointment can be changed to in office.  Daughter states he has to the virtual because he works.  Daughter states again he need s have the procedure and she does not know why he saw the " head cardiology instead the electrophysiologist first  for the referral. RN again explain to daughter. Daughter states  "I am not getting anywhere with this office I will call the other office and she hung up

## 2021-12-20 NOTE — Telephone Encounter (Signed)
See telephone note from 12/20/21. RN spoke to daughter

## 2021-12-20 NOTE — Telephone Encounter (Signed)
Called to speak with dtr. Spent almost 10 minutes on the phone. Confirmed virtual visit with Dr. Curt Bears next week and explained that Dr. Curt Bears has approved this visit at virtual. Dtr is very upset and not happy with our office.  She states that the appointment should have been made with EP to begin with and instead it got scheduled with Dr Percival Spanish and then had to wait even longer to get scheduled with EP MD.  States Dr. Percival Spanish told her that pt would be seen within the next week, and mad that did not happen.  She complains that we are delaying her fathers care all the while this "is affecting his heart & life".  Informed dtr (who barely let me speak and kept interrupting me when trying to speak) that we got the pt scheduled as soon as we could and will do our best to get this elective procedure scheduled. 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  She was told by [facility] MD yesterday that pt "needs this procedure now".  Informed that this is an elective procedure and we will do our best to schedule it (if MD agreeable at consult visit) it but that it would not be in the next several weeks.   Dtr argued several times that I did not know what I was talking about and this was not an elective procedure and that her dad needs this now.   I was not able to say more before she hung up the phone on me.

## 2021-12-21 ENCOUNTER — Telehealth: Payer: Self-pay | Admitting: Cardiology

## 2021-12-21 NOTE — Telephone Encounter (Signed)
Pt reaching out to see when he is able to set up ablation he is wanting it done as soon as possible

## 2021-12-21 NOTE — Telephone Encounter (Signed)
Discussed with the patient that he will need to have his visit with Dr. Ileana Ladd (to determine if he can have the procedure) before a procedure date can be picked. Educated the patient about virtual visits as he states "I have never had this type of visit before."  Verbalized understanding.

## 2021-12-25 ENCOUNTER — Telehealth (INDEPENDENT_AMBULATORY_CARE_PROVIDER_SITE_OTHER): Payer: BC Managed Care – PPO | Admitting: Cardiology

## 2021-12-25 ENCOUNTER — Other Ambulatory Visit: Payer: Self-pay

## 2021-12-25 ENCOUNTER — Encounter: Payer: Self-pay | Admitting: Cardiology

## 2021-12-25 VITALS — HR 78 | Ht 68.0 in | Wt 250.0 lb

## 2021-12-25 DIAGNOSIS — I471 Supraventricular tachycardia: Secondary | ICD-10-CM | POA: Diagnosis not present

## 2021-12-25 NOTE — Progress Notes (Signed)
Electrophysiology TeleHealth Note   Due to national recommendations of social distancing due to COVID 19, an audio/video telehealth visit is felt to be most appropriate for this patient at this time.  See Epic message for the patient's consent to telehealth for [facility].   Date:  12/25/2021   ID:  VARDAAN Orozco, DOB May 04, 1961, MRN [id number]  Location: patient's home  Provider location: [address], [place]  Evaluation Performed: Follow-up visit  PCP:  Andres Shad, MD  Cardiologist:  Hochrein Electrophysiologist:  Dr Curt Bears  Chief Complaint:  SVT  History of Present Illness:    Jacob Orozco is a 61 y.o. male who presents via audio/video conferencing for a telehealth visit today.  Since last being seen in our clinic, the patient reports doing very well.  Today, he denies symptoms of palpitations, chest pain, shortness of breath,  lower extremity edema, dizziness, presyncope, or syncope.  The patient is otherwise without complaint today.  The patient denies symptoms of fevers, chills, cough, or new SOB worrisome for COVID 19.  [name] is a 61 y.o. male who is being seen today for the evaluation of SVT at the request of Marijo File. Presenting today for electrophysiology evaluation.  He has a history significant for asthma, diabetes, hypertension, sleep apnea.  On March 30, he felt his heart racing.  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  He felt chest and neck discomfort and some numbness and tingling.  EMS was called and he was treated with adenosine.  He was treated with 50 mg of metoprolol.  He did well until December 29.  He was at work again when his heart rate went up to 220 bpm.  EMS was called and he was treated again with adenosine.  He was started on 100 mg of metoprolol.  On January 11 he had another episode of rapid heart rates.  He tried vagal maneuvers at the time.  He was found to have a  wide-complex tachycardia at the same rate of his prior tachycardias.  He was treated with amiodarone.  When he got to the emergency room, he was given adenosine which converted him to a narrow complex.  He had a left bundle branch block immediately after adenosine, though on his next ECG, left bundle branch normalized.  His amiodarone was increased to 100 mg twice daily.  He then had significant bradycardia and felt dizzy.  Troponins were normal at the time.  His evening metoprolol dose was decreased.  Today, denies symptoms of palpitations, chest pain, shortness of breath, orthopnea, PND, lower extremity edema, claudication, dizziness, presyncope, syncope, bleeding, or neurologic sequela. The patient is tolerating medications without difficulties.  He feels well today.  He has continued to have episodes of SVT.  At this point he would prefer ablation.  Past Medical History:  Diagnosis Date   Asthma    Carcinoid tumor    Diabetes mellitus without complication ([place])    Hypertension    Sleep apnea    cpap    Past Surgical History:  Procedure Laterality Date   APPENDECTOMY     CHOLECYSTECTOMY     INTERCOSTAL NERVE BLOCK Right 10/02/2020   Procedure: INTERCOSTAL NERVE BLOCK;  Surgeon: Melrose Nakayama, MD;  Location: [facility] OR;  Service: Thoracic;  Laterality: Right;   LUNG SEGMENTECTOMY Right 10/02/2020   XI ROBOTIC ASSISTED THORASCOPY-RIGHT LOWER    thorascopy Right 10/02/2020   XI  ROBOTIC ASSISTED THORASCOPY-RIGHT LOWER    TONSILLECTOMY      Current Outpatient Medications  Medication Sig Dispense Refill   amitriptyline (ELAVIL) 75 MG tablet Take 75 mg by mouth at bedtime.     amLODipine-valsartan (EXFORGE) 5-160 MG tablet Take 1 tablet by mouth daily.     budesonide-formoterol (SYMBICORT) 160-4.5 MCG/ACT inhaler Inhale 2 puffs into the lungs 2 (two) times daily.     LIVALO 4 MG TABS Take 1 tablet by mouth daily.     metFORMIN (GLUCOPHAGE-XR) 500 MG 24 hr tablet Take 500 mg by mouth in  the morning and at bedtime.     metoprolol tartrate (LOPRESSOR) 50 MG tablet Take 50 mg by mouth 2 (two) times daily.     No current facility-administered medications for this visit.    Allergies:   Morphine, Penicillins, Ciprofloxacin, and Codeine   Social History:  The patient  reports that he quit smoking about 18 months ago. His smoking use included cigarettes. He smoked an average of .5 packs per day. He has never used smokeless tobacco. He reports that he does not drink alcohol and does not use drugs.   Family History:  The patient's  family history includes Coronary artery disease in his father; Diabetes in his father and mother; Hypertrophic cardiomyopathy in his brother; Pulmonary fibrosis in his mother; Stroke in his father.   ROS:  Please see the history of present illness.   All other systems are personally reviewed and negative.    Exam:    Vital Signs:  Pulse 78    Ht 5\' 8"  (1.727 m)    Wt 250 lb (113.4 kg)    BMI 38.01 kg/m   Well appearing, alert and conversant, regular work of breathing,  good skin color Eyes- anicteric, neuro- grossly intact, skin- no apparent rash or lesions or cyanosis, mouth- oral mucosa is pink  Labs/Other Tests and Data Reviewed:    Recent Labs: 06/04/2021: ALT 32; BUN 19; Creatinine 1.23; Hemoglobin 14.1; Platelet Count 173; Potassium 4.6; Sodium 136   Wt Readings from Last 3 Encounters:  12/25/21 250 lb (113.4 kg)  11/23/21 267 lb 6.4 oz (121.3 kg)  06/05/21 275 lb 8 oz (125 kg)     Other studies personally reviewed: Additional studies/ records that were reviewed today include: ECG 11/23/2021 personally reviewed Review of the above records today demonstrates: Sinus rhythm   ASSESSMENT & PLAN:    1.  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  We are working to get results from his prior hospitalizations and EMS visits.  He would benefit from ablation.  He would prefer to avoid further medication management.  Due to that, we Gearald Stonebraker plan for  ablation.  Risk and benefits have been discussed with bleeding, tamponade, heart block, stroke, among others.  He understands these risks and is agreed to the procedure.  2.  Obstructive sleep apnea: CPAP compliance encouraged  COVID 19 screen The patient denies symptoms of COVID 19 at this time.  The importance of social distancing was discussed today.  Follow-up:  3 months  Current medicines are reviewed at length with the patient today.   The patient does not have concerns regarding his medicines.  The following changes were made today:  none  Labs/ tests ordered today include:  No orders of the defined types were placed in this encounter.    Patient Risk:  after full review of this patients clinical status, I feel that they are at moderate risk  at this time.     Signed, Cassandre Oleksy Meredith Leeds, MD  12/25/2021 8:48 AM     [facility] [address] [place] [address] [phone] (office) [phone] (fax)

## 2021-12-28 NOTE — Telephone Encounter (Signed)
Left message to call back  

## 2021-12-28 NOTE — Telephone Encounter (Signed)
Pt cannot schedule ablation for 3/31, but would like to move it to 3/28. ?Aware I will arrange. ?

## 2022-01-15 ENCOUNTER — Encounter: Payer: Self-pay | Admitting: Cardiology

## 2022-01-16 NOTE — Telephone Encounter (Signed)
Pt following up in regard to message yesterday... please advise  ? ?Cb# [id number] ?

## 2022-01-21 NOTE — Pre-Procedure Instructions (Signed)
Instructed patient on the following items: ?Arrival time 1030 ?Nothing to eat or drink after midnight ?No meds AM of procedure ?Responsible person to drive you home and stay with you for 24 hrs ? ? ?   ?

## 2022-01-22 ENCOUNTER — Encounter (HOSPITAL_COMMUNITY): Payer: Self-pay | Admitting: Cardiology

## 2022-01-22 ENCOUNTER — Ambulatory Visit (HOSPITAL_COMMUNITY): Payer: BC Managed Care – PPO | Admitting: Certified Registered Nurse Anesthetist

## 2022-01-22 ENCOUNTER — Other Ambulatory Visit: Payer: Self-pay

## 2022-01-22 ENCOUNTER — Encounter (HOSPITAL_COMMUNITY)
Admission: RE | Disposition: A | Discharge status: Home / Self Care | Payer: Self-pay | Source: Home / Self Care | Attending: Cardiology

## 2022-01-22 ENCOUNTER — Ambulatory Visit (HOSPITAL_COMMUNITY)
Admission: RE | Admit: 2022-01-22 | Discharge: 2022-01-22 | Disposition: A | Discharge status: Home / Self Care | Payer: BC Managed Care – PPO | Attending: Cardiology | Admitting: Cardiology

## 2022-01-22 DIAGNOSIS — I10 Essential (primary) hypertension: Secondary | ICD-10-CM | POA: Diagnosis not present

## 2022-01-22 DIAGNOSIS — I471 Supraventricular tachycardia, unspecified: Secondary | ICD-10-CM | POA: Diagnosis present

## 2022-01-22 DIAGNOSIS — Z79899 Other long term (current) drug therapy: Secondary | ICD-10-CM | POA: Diagnosis not present

## 2022-01-22 DIAGNOSIS — Z87891 Personal history of nicotine dependence: Secondary | ICD-10-CM | POA: Insufficient documentation

## 2022-01-22 DIAGNOSIS — Z7984 Long term (current) use of oral hypoglycemic drugs: Secondary | ICD-10-CM | POA: Insufficient documentation

## 2022-01-22 DIAGNOSIS — G4733 Obstructive sleep apnea (adult) (pediatric): Secondary | ICD-10-CM | POA: Insufficient documentation

## 2022-01-22 DIAGNOSIS — Z7951 Long term (current) use of inhaled steroids: Secondary | ICD-10-CM | POA: Diagnosis not present

## 2022-01-22 DIAGNOSIS — J45909 Unspecified asthma, uncomplicated: Secondary | ICD-10-CM | POA: Insufficient documentation

## 2022-01-22 DIAGNOSIS — E119 Type 2 diabetes mellitus without complications: Secondary | ICD-10-CM | POA: Insufficient documentation

## 2022-01-22 HISTORY — PX: SVT ABLATION: EP1225

## 2022-01-22 LAB — GLUCOSE, CAPILLARY
Glucose-Capillary: 234 mg/dL — ABNORMAL HIGH (ref 70–99)
Glucose-Capillary: 213 mg/dL — ABNORMAL HIGH (ref 70–99)
Glucose-Capillary: 187 mg/dL — ABNORMAL HIGH (ref 70–99)

## 2022-01-22 LAB — CBC
HCT: 44.2 % (ref 39.0–52.0)
Hemoglobin: 14.8 g/dL (ref 13.0–17.0)
MCH: 28.4 pg (ref 26.0–34.0)
MCHC: 33.5 g/dL (ref 30.0–36.0)
MCV: 84.8 fL (ref 80.0–100.0)
Platelets: 200 10*3/uL (ref 150–400)
RBC: 5.21 MIL/uL (ref 4.22–5.81)
RDW: 14.6 % (ref 11.5–15.5)
WBC: 10.7 10*3/uL — ABNORMAL HIGH (ref 4.0–10.5)
nRBC: 0 % (ref 0.0–0.2)

## 2022-01-22 LAB — BASIC METABOLIC PANEL
Anion gap: 10 (ref 5–15)
BUN: 16 mg/dL (ref 6–20)
CO2: 23 mmol/L (ref 22–32)
Calcium: 9 mg/dL (ref 8.9–10.3)
Chloride: 102 mmol/L (ref 98–111)
Creatinine, Ser: 0.9 mg/dL (ref 0.61–1.24)
GFR, Estimated: >60 mL/min (ref >60)
Glucose, Bld: 245 mg/dL — ABNORMAL HIGH (ref 70–99)
Potassium: 3.9 mmol/L (ref 3.5–5.1)
Sodium: 135 mmol/L (ref 135–145)

## 2022-01-22 SURGERY — SVT ABLATION
Anesthesia: Monitor Anesthesia Care

## 2022-01-22 MED ORDER — ONDANSETRON HCL 4 MG/2ML IJ SOLN
INTRAMUSCULAR | Status: DC | PRN
  Administered 2022-01-22: 4 mg via INTRAVENOUS

## 2022-01-22 MED ORDER — HEPARIN (PORCINE) IN NACL 1000-0.9 UT/500ML-% IV SOLN
INTRAVENOUS | Status: AC
  Filled 2022-01-22: qty 500

## 2022-01-22 MED ORDER — HEPARIN (PORCINE) IN NACL 1000-0.9 UT/500ML-% IV SOLN
INTRAVENOUS | Status: DC | PRN
  Administered 2022-01-22: 500 mL

## 2022-01-22 MED ORDER — ONDANSETRON HCL 4 MG/2ML IJ SOLN: 4.0000 mg | Freq: Four times a day (QID) | INTRAMUSCULAR | Status: DC | PRN

## 2022-01-22 MED ORDER — PROPOFOL 10 MG/ML IV BOLUS
INTRAVENOUS | Status: DC | PRN
  Administered 2022-01-22 (×2): 10 mg via INTRAVENOUS
  Administered 2022-01-22 (×2): 20 mg via INTRAVENOUS
  Administered 2022-01-22: 10 mg via INTRAVENOUS

## 2022-01-22 MED ORDER — BUPIVACAINE HCL (PF) 0.25 % IJ SOLN
INTRAMUSCULAR | Status: DC | PRN
  Administered 2022-01-22: 40 mL

## 2022-01-22 MED ORDER — SODIUM CHLORIDE 0.9 % IV SOLN: 250.0000 mL | INTRAVENOUS | Status: DC | PRN

## 2022-01-22 MED ORDER — SODIUM CHLORIDE 0.9% FLUSH: 3.0000 mL | Freq: Two times a day (BID) | INTRAVENOUS | Status: DC

## 2022-01-22 MED ORDER — ACETAMINOPHEN 325 MG PO TABS
650.0000 mg | ORAL_TABLET | ORAL | Status: DC | PRN
  Filled 2022-01-22: qty 2

## 2022-01-22 MED ORDER — INSULIN ASPART 100 UNIT/ML IJ SOLN
INTRAMUSCULAR | Status: DC | PRN
  Administered 2022-01-22: 6 [IU] via SUBCUTANEOUS
  Administered 2022-01-22: 4 [IU] via SUBCUTANEOUS

## 2022-01-22 MED ORDER — SODIUM CHLORIDE 0.9 % IV SOLN: INTRAVENOUS | Status: DC

## 2022-01-22 MED ORDER — BUPIVACAINE HCL (PF) 0.25 % IJ SOLN
INTRAMUSCULAR | Status: AC
  Filled 2022-01-22: qty 60

## 2022-01-22 MED ORDER — MIDAZOLAM HCL 2 MG/2ML IJ SOLN
INTRAMUSCULAR | Status: DC | PRN
  Administered 2022-01-22: 1 mg via INTRAVENOUS

## 2022-01-22 MED ORDER — HEPARIN SODIUM (PORCINE) 1000 UNIT/ML IJ SOLN
INTRAMUSCULAR | Status: DC | PRN
  Administered 2022-01-22: 1000 [IU] via INTRAVENOUS

## 2022-01-22 MED ORDER — FENTANYL CITRATE (PF) 100 MCG/2ML IJ SOLN
INTRAMUSCULAR | Status: DC | PRN
  Administered 2022-01-22: 25 ug via INTRAVENOUS

## 2022-01-22 MED ORDER — SODIUM CHLORIDE 0.9% FLUSH: 3.0000 mL | INTRAVENOUS | Status: DC | PRN

## 2022-01-22 MED ORDER — PROPOFOL 500 MG/50ML IV EMUL
INTRAVENOUS | Status: DC | PRN
  Administered 2022-01-22: 50 ug/kg/min via INTRAVENOUS

## 2022-01-22 SURGICAL SUPPLY — 14 items
BAG SNAP BAND KOVER 36X36 (MISCELLANEOUS) ×1 IMPLANT
CATH EZ STEER NAV 4MM D-F CUR (ABLATOR) ×1 IMPLANT
CATH JOSEPH QUAD ALLRED 6F REP (CATHETERS) ×2 IMPLANT
CATH WEB BI DIR CSDF CRV REPRO (CATHETERS) ×1 IMPLANT
CLOSURE PERCLOSE PROSTYLE (VASCULAR PRODUCTS) ×4 IMPLANT
MAT PREVALON FULL STRYKER (MISCELLANEOUS) ×1 IMPLANT
PACK EP LATEX FREE (CUSTOM PROCEDURE TRAY) ×2
PACK EP LF (CUSTOM PROCEDURE TRAY) ×1 IMPLANT
PAD DEFIB RADIO PHYSIO CONN (PAD) ×2 IMPLANT
PATCH CARTO3 (PAD) ×1 IMPLANT
SHEATH PINNACLE 6F 10CM (SHEATH) ×2 IMPLANT
SHEATH PINNACLE 7F 10CM (SHEATH) ×1 IMPLANT
SHEATH PINNACLE 8F 10CM (SHEATH) ×1 IMPLANT
SHEATH PROBE COVER 6X72 (BAG) ×1 IMPLANT

## 2022-01-22 NOTE — Anesthesia Postprocedure Evaluation (Signed)
Anesthesia Post Note ? ?Patient: RITHY MANDLEY ? ?Procedure(s) Performed: SVT ABLATION ? ?  ? ?Patient location during evaluation: PACU ?Anesthesia Type: MAC ?Level of consciousness: awake and alert ?Pain management: pain level controlled ?Vital Signs Assessment: post-procedure vital signs reviewed and stable ?Respiratory status: spontaneous breathing, nonlabored ventilation, respiratory function stable and patient connected to nasal cannula oxygen ?Cardiovascular status: stable and blood pressure returned to baseline ?Postop Assessment: no apparent nausea or vomiting ?Anesthetic complications: no ? ? ?There were no known notable events for this encounter. ? ?Last Vitals:  ?Vitals:  ? 01/22/22 1430 01/22/22 1512  ?BP: 127/68 126/77  ?Pulse: 91 92  ?Resp:  20  ?Temp:    ?SpO2: 92% 94%  ?  ?Last Pain:  ?Vitals:  ? 01/22/22 1307  ?TempSrc:   ?PainSc: 0-No pain  ? ? ?  ?  ?  ?  ?  ?  ? ?Effie Berkshire ? ? ? ? ?

## 2022-01-22 NOTE — Transfer of Care (Signed)
Immediate Anesthesia Transfer of Care Note ? ?Patient: Jacob Orozco ? ?Procedure(s) Performed: SVT ABLATION ? ?Patient Location: Cath Lab ? ?Anesthesia Type:MAC ? ?Level of Consciousness: awake, alert  and oriented ? ?Airway & Oxygen Therapy: Patient Spontanous Breathing and Patient connected to face mask oxygen ? ?Post-op Assessment: Report given to RN and Post -op Vital signs reviewed and stable ? ?Post vital signs: Reviewed and stable ? ?Last Vitals:  ?Vitals Value Taken Time  ?BP 106/75   ?Temp    ?Pulse 88   ?Resp 20   ?SpO2 98   ? ? ?Last Pain:  ?Vitals:  ? 01/22/22 1000  ?TempSrc: Oral  ?PainSc:   ?   ? ?  ? ?Complications: There were no known notable events for this encounter. ?

## 2022-01-22 NOTE — Interval H&P Note (Signed)
History and Physical Interval Note: ? ?01/22/2022 ?10:17 AM ? ?Jacob Orozco  has presented today for surgery, with the diagnosis of svt.  The various methods of treatment have been discussed with the patient and family. After consideration of risks, benefits and other options for treatment, the patient has consented to  Procedure(s): ?SVT ABLATION (N/A) as a surgical intervention.  The patient's history has been reviewed, patient examined, no change in status, stable for surgery.  I have reviewed the patient's chart and labs.  Questions were answered to the patient's satisfaction.   ? ? ?Daivon Rayos Hassell Done Glanda Spanbauer ? ? ?

## 2022-01-22 NOTE — Anesthesia Preprocedure Evaluation (Signed)
Anesthesia Evaluation  ?Patient identified by MRN, date of birth, ID band ?Patient awake ? ? ? ?Reviewed: ?Allergy & Precautions, NPO status , Patient's Chart, lab work & pertinent test results ? ?Airway ?Mallampati: III ? ?TM Distance: >3 FB ?Neck ROM: Full ? ? ? Dental ? ?(+) Teeth Intact, Dental Advisory Given ?  ?Pulmonary ?asthma , sleep apnea and Continuous Positive Airway Pressure Ventilation , COPD, former smoker,  ?  ?breath sounds clear to auscultation ? ? ? ? ? ? Cardiovascular ?hypertension,  ?Rhythm:Regular Rate:Normal ? ? ?  ?Neuro/Psych ?negative neurological ROS ? negative psych ROS  ? GI/Hepatic ?negative GI ROS, Neg liver ROS,   ?Endo/Other  ?diabetes ? Renal/GU ?negative Renal ROS  ? ?  ?Musculoskeletal ? ? Abdominal ?Normal abdominal exam  (+)   ?Peds ? Hematology ?  ?Anesthesia Other Findings ? ? Reproductive/Obstetrics ? ?  ? ? ? ? ? ? ? ? ? ? ? ? ? ?  ?  ? ? ? ? ? ? ? ? ?Anesthesia Physical ?Anesthesia Plan ? ?ASA: 2 ? ?Anesthesia Plan: MAC  ? ?Post-op Pain Management:   ? ?Induction: Intravenous ? ?PONV Risk Score and Plan: 0 and Propofol infusion ? ?Airway Management Planned: Simple Face Mask and Natural Airway ? ?Additional Equipment: None ? ?Intra-op Plan:  ? ?Post-operative Plan:  ? ?Informed Consent: I have reviewed the patients History and Physical, chart, labs and discussed the procedure including the risks, benefits and alternatives for the proposed anesthesia with the patient or authorized representative who has indicated his/her understanding and acceptance.  ? ? ? ? ? ?Plan Discussed with: CRNA ? ?Anesthesia Plan Comments:   ? ? ? ? ? ? ?Anesthesia Quick Evaluation ? ?

## 2022-01-23 ENCOUNTER — Encounter (HOSPITAL_COMMUNITY): Payer: Self-pay | Admitting: Cardiology

## 2022-02-25 ENCOUNTER — Ambulatory Visit (INDEPENDENT_AMBULATORY_CARE_PROVIDER_SITE_OTHER): Payer: BC Managed Care – PPO | Admitting: Cardiology

## 2022-02-25 ENCOUNTER — Encounter: Payer: Self-pay | Admitting: Cardiology

## 2022-02-25 VITALS — BP 126/70 | HR 83 | Ht 70.0 in | Wt 273.0 lb

## 2022-02-25 DIAGNOSIS — I471 Supraventricular tachycardia: Secondary | ICD-10-CM | POA: Diagnosis not present

## 2022-02-25 NOTE — Progress Notes (Signed)
? ?Electrophysiology Office Note ? ? ?Date:  02/25/2022  ? ?ID:  Jacob Orozco, DOB 10/21/1961, MRN [id number] ? ?PCP:  Andres Shad, MD  ?Cardiologist:  Hochrein ?Primary Electrophysiologist:  Susanna Benge Meredith Leeds, MD   ? ?Chief Complaint: SVT ?  ?History of Present Illness: ?Jacob Orozco is a 61 y.o. male who is being seen today for the evaluation of SVT at the request of Andres Shad, *. Presenting today for electrophysiology evaluation. ? ?Has a history significant for asthma, diabetes, hypertension, sleep apnea, SVT.  He had an episode of SVT with heart rates up to 16 bpm.  He was started on metoprolol.  EMS treated him with adenosine.  He is continued to have episodes of SVT.  He is now status post SVT ablation 01/22/2022.  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 ? ?Today, he denies symptoms of palpitations, chest pain, shortness of breath, orthopnea, PND, lower extremity edema, claudication, dizziness, presyncope, syncope, bleeding, or neurologic sequela. The patient is tolerating medications without difficulties.  Since his ablation he has done well.  He has had no further episodes of SVT.  He is overall quite happy with how he is feeling.  He is able to exert himself without issue.  He is no longer nervous about arrhythmias. ? ? ?Past Medical History:  ?Diagnosis Date  ? Asthma   ? Carcinoid tumor   ? Diabetes mellitus without complication ([place])   ? Hypertension   ? Sleep apnea   ? cpap  ? ?Past Surgical History:  ?Procedure Laterality Date  ? APPENDECTOMY    ? CHOLECYSTECTOMY    ? INTERCOSTAL NERVE BLOCK Right 10/02/2020  ? Procedure: INTERCOSTAL NERVE BLOCK;  Surgeon: Melrose Nakayama, MD;  Location: [facility];  Service: Thoracic;  Laterality: Right;  ? LUNG SEGMENTECTOMY Right 10/02/2020  ? XI ROBOTIC ASSISTED THORASCOPY-RIGHT LOWER   ? SVT ABLATION N/A 01/22/2022  ? Procedure: SVT ABLATION;  Surgeon: Constance Haw, MD;  Location: [facility];  Service: Cardiovascular;   Laterality: N/A;  ? thorascopy Right 10/02/2020  ? XI ROBOTIC ASSISTED THORASCOPY-RIGHT LOWER   ? TONSILLECTOMY    ? ? ? ?Current Outpatient Medications  ?Medication Sig Dispense Refill  ? amitriptyline (ELAVIL) 75 MG tablet Take 75 mg by mouth at bedtime as needed for sleep.    ? amLODipine-valsartan (EXFORGE) 5-160 MG tablet Take 1 tablet by mouth daily.    ? aspirin EC 81 MG tablet Take 81 mg by mouth daily. Swallow whole.    ? metFORMIN (GLUCOPHAGE-XR) 500 MG 24 hr tablet Take 500 mg by mouth in the morning and at bedtime.    ? ?No current facility-administered medications for this visit.  ? ? ?Allergies:   Morphine, Penicillins, Ciprofloxacin, and Codeine  ? ?Social History:  The patient  reports that he quit smoking about 20 months ago. His smoking use included cigarettes. He smoked an average of .5 packs per day. He has never used smokeless tobacco. He reports that he does not drink alcohol and does not use drugs.  ? ?Family History:  The patient's family history includes Coronary artery disease in his father; Diabetes in his father and mother; Hypertrophic cardiomyopathy in his brother; Pulmonary fibrosis in his mother; Stroke in his father.  ? ? ?ROS:  Please see the history of present illness.   Otherwise, review of systems is positive for none.   All other systems are reviewed and negative.  ? ? ?PHYSICAL EXAM: ?VS:  BP 126/70  Pulse 83   Ht 5\' 10"  (1.778 m)   Wt 273 lb (123.8 kg)   SpO2 94%   BMI 39.17 kg/m?  , BMI Body mass index is 39.17 kg/m?. ?GEN: Well nourished, well developed, in no acute distress  ?HEENT: normal  ?Neck: no JVD, carotid bruits, or masses ?Cardiac: RRR; no murmurs, rubs, or gallops,no edema  ?Respiratory:  clear to auscultation bilaterally, normal work of breathing ?GI: soft, nontender, nondistended, + BS ?MS: no deformity or atrophy  ?Skin: warm and dry ?Neuro:  Strength and sensation are intact ?Psych: euthymic mood, full affect ? ?EKG:  EKG is ordered today. ?Personal  review of the ekg ordered shows sinus rhythm, rate 83 ? ?Recent Labs: ?06/04/2021: ALT 32 ?01/22/2022: BUN 16; Creatinine, Ser 0.90; Hemoglobin 14.8; Platelets 200; Potassium 3.9; Sodium 135  ? ? ?Lipid Panel  ?No results found for: CHOL, TRIG, HDL, CHOLHDL, VLDL, LDLCALC, LDLDIRECT ? ? ?Wt Readings from Last 3 Encounters:  ?02/25/22 273 lb (123.8 kg)  ?01/22/22 250 lb (113.4 kg)  ?12/25/21 250 lb (113.4 kg)  ?  ? ? ?Other studies Reviewed: ?Additional studies/ records that were reviewed today include: TTE 2022  ?Review of the above records today demonstrates:  ?Ejection fraction 60 to 65% ?normal right ventricular size and function normal aortic valve area.   ? ?ASSESSMENT AND PLAN:  ? 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  He has not had any further episodes of SVT since his ablation.  He is currently feeling well.  Due to that we Umer Harig stop his metoprolol.  We Tally Mckinnon see him back on an as-needed basis. ? ? ? ?Current medicines are reviewed at length with the patient today.   ?The patient does not have concerns regarding his medicines.  The following changes were made today: Stop metoprolol ? ?Labs/ tests ordered today include:  ?Orders Placed This Encounter  ?Procedures  ? EKG 12-Lead  ? ? ? ?Disposition:   FU with Daylani Deblois as needed d ? ?Signed, ?Dainel Arcidiacono Meredith Leeds, MD  ?02/25/2022 11:42 AM    ? ?[facility] ?[address] ?[address] ?[place] [address] ?[phone]) ?[phone] ? ?

## 2022-02-25 NOTE — Patient Instructions (Signed)
Medication Instructions:  ?Your physician has recommended you make the following change in your medication:  ?STOP Metoprolol ? ?*If you need a refill on your cardiac medications before your next appointment, please call your pharmacy* ? ? ?Lab Work: ?None ordered ? ? ?Testing/Procedures: ?None ordered ? ? ?Follow-Up: ?At [facility], you and your health needs are our priority.  As part of our continuing mission to provide you with exceptional heart care, we have created designated Provider Care Teams.  These Care Teams include your primary Cardiologist (physician) and Advanced Practice Providers (APPs -  Physician Assistants and Nurse Practitioners) who all work together to provide you with the care you need, when you need it. ? ?Your next appointment:   ?as  needed ? ?The format for your next appointment:   ?In Person ? ?Provider:   ?Allegra Lai, MD ? ? ? ?Thank you for choosing [facility]!! ? ? ?Trinidad Curet, RN ?([phone] ?  ?

## 2022-05-29 ENCOUNTER — Telehealth: Payer: Self-pay | Admitting: Internal Medicine

## 2022-05-29 NOTE — Telephone Encounter (Signed)
Cancelled per 8/2 in basket, called pt to see if they wanted to r/s pt hung up phone

## 2022-05-31 ENCOUNTER — Inpatient Hospital Stay: Payer: BC Managed Care – PPO

## 2022-06-03 ENCOUNTER — Inpatient Hospital Stay: Payer: BC Managed Care – PPO | Admitting: Internal Medicine

## 2022-09-14 ENCOUNTER — Encounter (HOSPITAL_COMMUNITY): Payer: Self-pay

## 2022-09-14 ENCOUNTER — Emergency Department (HOSPITAL_COMMUNITY)
Admission: EM | Admit: 2022-09-14 | Discharge: 2022-09-14 | Disposition: A | Discharge status: Home / Self Care | Payer: BC Managed Care – PPO | Attending: Emergency Medicine | Admitting: Emergency Medicine

## 2022-09-14 ENCOUNTER — Other Ambulatory Visit: Payer: Self-pay

## 2022-09-14 DIAGNOSIS — Z79899 Other long term (current) drug therapy: Secondary | ICD-10-CM | POA: Diagnosis not present

## 2022-09-14 DIAGNOSIS — L02811 Cutaneous abscess of head [any part, except face]: Secondary | ICD-10-CM | POA: Diagnosis not present

## 2022-09-14 DIAGNOSIS — Z7982 Long term (current) use of aspirin: Secondary | ICD-10-CM | POA: Insufficient documentation

## 2022-09-14 DIAGNOSIS — Z85118 Personal history of other malignant neoplasm of bronchus and lung: Secondary | ICD-10-CM | POA: Diagnosis not present

## 2022-09-14 DIAGNOSIS — J449 Chronic obstructive pulmonary disease, unspecified: Secondary | ICD-10-CM | POA: Insufficient documentation

## 2022-09-14 DIAGNOSIS — I10 Essential (primary) hypertension: Secondary | ICD-10-CM | POA: Diagnosis not present

## 2022-09-14 DIAGNOSIS — Z7984 Long term (current) use of oral hypoglycemic drugs: Secondary | ICD-10-CM | POA: Insufficient documentation

## 2022-09-14 DIAGNOSIS — E119 Type 2 diabetes mellitus without complications: Secondary | ICD-10-CM | POA: Diagnosis not present

## 2022-09-14 DIAGNOSIS — R519 Headache, unspecified: Secondary | ICD-10-CM | POA: Diagnosis present

## 2022-09-14 MED ORDER — OXYCODONE-ACETAMINOPHEN 5-325 MG PO TABS
1.0000 | ORAL_TABLET | Freq: Once | ORAL | Status: AC
  Administered 2022-09-14: 1 via ORAL
  Filled 2022-09-14: qty 1

## 2022-09-14 MED ORDER — DOXYCYCLINE HYCLATE 100 MG PO CAPS: 100.0000 mg | ORAL_CAPSULE | Freq: Two times a day (BID) | ORAL | 0 refills | Status: DC

## 2022-09-14 MED ORDER — ACETAMINOPHEN 500 MG PO TABS: 500.0000 mg | ORAL_TABLET | Freq: Four times a day (QID) | ORAL | 0 refills | Status: AC | PRN

## 2022-09-14 MED ORDER — LIDOCAINE-EPINEPHRINE (PF) 2 %-1:200000 IJ SOLN
10.0000 mL | Freq: Once | INTRAMUSCULAR | Status: AC
  Administered 2022-09-14: 10 mL via INTRADERMAL
  Filled 2022-09-14: qty 20

## 2022-09-14 NOTE — ED Provider Notes (Signed)
[facility] EMERGENCY DEPARTMENT Provider Note   CSN: [id number] Arrival date & time: 09/14/22  1535     History  Chief Complaint  Patient presents with   Abscess    Jacob Orozco is a 61 y.o. male.  The history is provided by the patient and medical records. No language interpreter was used.  Abscess    61 year old male significant history of diabetes, COPD, hypertension, lung cancer, does wear CPAP at night presenting with complaints of scalp infection.  Patient Dors for the past 2 to 3 days he has had pain about his posterior scalp at the site of his CPAP strap.  Pain is sharp throbbing moderate intensity worse with palpation and with movement.  He also noticed purulent discharge coming from the site.  He is concerned about an abscess.  He does not endorse any fever, severe headache, or numbness.  He denies any trauma.  He is up-to-date with tetanus.  Tylenol does help the pain somewhat.  Home Medications Prior to Admission medications   Medication Sig Start Date End Date Taking? Authorizing Provider  amitriptyline (ELAVIL) 75 MG tablet Take 75 mg by mouth at bedtime as needed for sleep. 11/08/20   [provider]  amLODipine-valsartan (EXFORGE) 5-160 MG tablet Take 1 tablet by mouth daily.    [provider]  aspirin EC 81 MG tablet Take 81 mg by mouth daily. Swallow whole.    [provider]  metFORMIN (GLUCOPHAGE-XR) 500 MG 24 hr tablet Take 500 mg by mouth in the morning and at bedtime.    [provider]      Allergies    Morphine, Penicillins, Ciprofloxacin, and Codeine    Review of Systems   Review of Systems  All other systems reviewed and are negative.   Physical Exam Updated Vital Signs BP (!) 148/71 (BP Location: Right Arm)   Pulse (!) 103   Temp 98.2 F (36.8 C) (Oral)   Resp 18   Ht 5\' 10"  (1.778 m)   Wt 111.1 kg   SpO2 96%   BMI 35.15 kg/m  Physical Exam Vitals and nursing note reviewed.  Constitutional:       General: He is not in acute distress.    Appearance: He is well-developed.  HENT:     Head: Atraumatic.     Comments: Scalp: to left occiput of scalp there is an area of induration measuring approximately 3 cm in diameter oozing out purulent  Discharge with surrounding skin erythema.  Area is tender to palpation. Eyes:     Conjunctiva/sclera: Conjunctivae normal.  Musculoskeletal:     Cervical back: Neck supple.  Skin:    Findings: No rash.  Neurological:     Mental Status: He is alert.     ED Results / Procedures / Treatments   Labs (all labs ordered are listed, but only abnormal results are displayed) Labs Reviewed - No data to display  EKG None  Radiology No results found.  Procedures .Marland KitchenIncision and Drainage  Date/Time: 09/14/2022 5:34 PM  Performed by: Domenic Moras, PA-C Authorized by: Domenic Moras, PA-C   Consent:    Consent obtained:  Verbal   Consent given by:  Patient   Risks discussed:  Bleeding, incomplete drainage, pain and damage to other organs   Alternatives discussed:  No treatment Universal protocol:    Procedure explained and questions answered to patient or proxy's satisfaction: yes     Relevant documents present and verified: yes     Test  results available : yes     Imaging studies available: yes     Required blood products, implants, devices, and special equipment available: yes     Site/side marked: yes     Immediately prior to procedure, a time out was called: yes     Patient identity confirmed:  Verbally with patient Location:    Type:  Abscess   Size:  3   Location:  Head   Head location:  Scalp Pre-procedure details:    Skin preparation:  Betadine Anesthesia:    Anesthesia method:  Local infiltration   Local anesthetic:  Lidocaine 1% WITH epi Procedure type:    Complexity:  Simple Procedure details:    Incision types:  Single straight   Incision depth:  Subcutaneous   Wound management:  Probed and deloculated, irrigated with  saline and extensive cleaning   Drainage:  Purulent   Drainage amount:  Scant   Packing materials:  None Post-procedure details:    Procedure completion:  Tolerated with difficulty     Medications Ordered in ED Medications  lidocaine-EPINEPHrine (XYLOCAINE W/EPI) 2 %-1:200000 (PF) injection 10 mL (has no administration in time range)  oxyCODONE-acetaminophen (PERCOCET/ROXICET) 5-325 MG per tablet 1 tablet (has no administration in time range)    ED Course/ Medical Decision Making/ A&P                           Medical Decision Making Risk Prescription drug management.   BP (!) 148/71 (BP Location: Right Arm)   Pulse (!) 103   Temp 98.2 F (36.8 C) (Oral)   Resp 18   Ht 5\' 10"  (1.778 m)   Wt 111.1 kg   SpO2 96%   BMI 35.15 kg/m   19:53 PM 61 year old male significant history of diabetes, COPD, hypertension, lung cancer, does wear CPAP at night presenting with complaints of scalp infection.  Patient Dors for the past 2 to 3 days he has had pain about his posterior scalp at the site of his CPAP strap.  Pain is sharp throbbing moderate intensity worse with palpation and with movement.  He also noticed purulent discharge coming from the site.  He is concerned about an abscess.  He does not endorse any fever, severe headache, or numbness.  He denies any trauma.  He is up-to-date with tetanus.  Tylenol does help the pain somewhat.  On exam patient has an area of induration oozing out.  Discharge noted to his left occipital scalp.  Area is tender to palpation with surrounding erythema.  Finding consistent with a draining abscess.  Plan to perform incision and drainage to allow better drainage of this abscess for better healing.  DDX: abscess, cellulitis, squamous cell carcinoma, basal cell carcinoma, melanoma, lymphadenitis -treatment includes I&D, percocet with improvement of sxs -PCP office notes or outside notes reviewed -Escalation to admission/observation considered: patients  feels much better, is comfortable with discharge, and will follow up with PCP -Prescription medication considered, patient comfortable with doxycycline and tylenol -Social Determinant of Health considered which includes tobacco use, recommend cessation         Final Clinical Impression(s) / ED Diagnoses Final diagnoses:  Scalp abscess    Rx / DC Orders ED Discharge Orders          Ordered    doxycycline (VIBRAMYCIN) 100 MG capsule  2 times daily        09/14/22 1739    acetaminophen (TYLENOL) 500 MG tablet  Every 6 hours PRN        09/14/22 1739              Domenic Moras, PA-C 09/14/22 [place], Ankit, MD 09/14/22 [phone]

## 2022-09-14 NOTE — Discharge Instructions (Signed)
Apply warm moist compress to affected area several times daily to aid with healing.  Take antibiotic as prescribed.  Return if symptoms worsen

## 2022-09-14 NOTE — ED Triage Notes (Signed)
Patient arrives from home c/o abscess to back of head on the lower left side. Pt states he wears a CPAP at night and states "he thinks the CPAP headgear" caused the back of his head to get irritated. On inspection, abscess appears to have oozed purulent drainage; pt states it is tender on touch. Pt states it "hurts when he tries to turn his head."

## 2022-11-20 IMAGING — DX DG CHEST 2V
2 series · 2 of 2 positions shown · non-contrast
Comparison: October 04, 2020.

CLINICAL DATA: Chest tube placement.

EXAM:
CHEST - 2 VIEW

[chest lat]
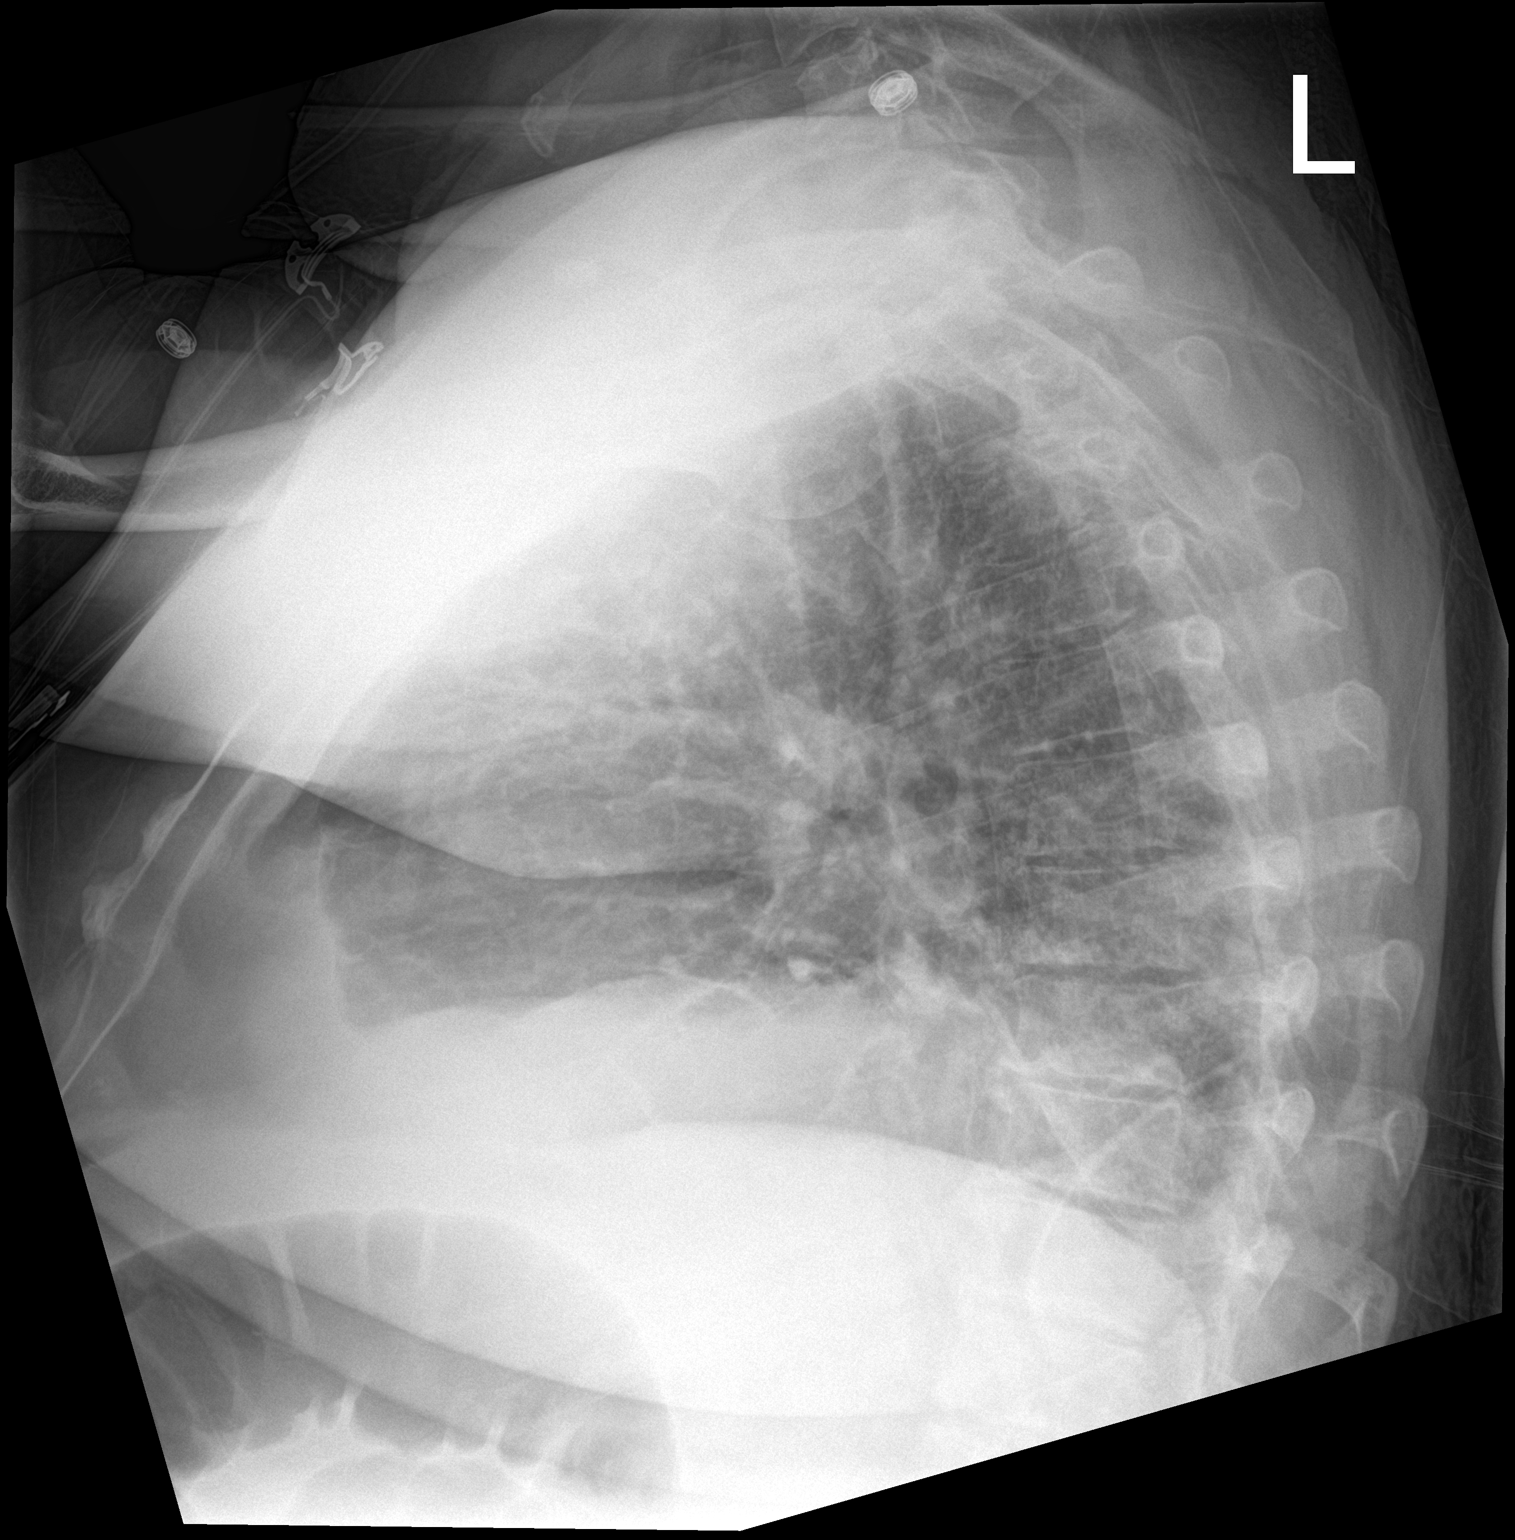

[chest ap]
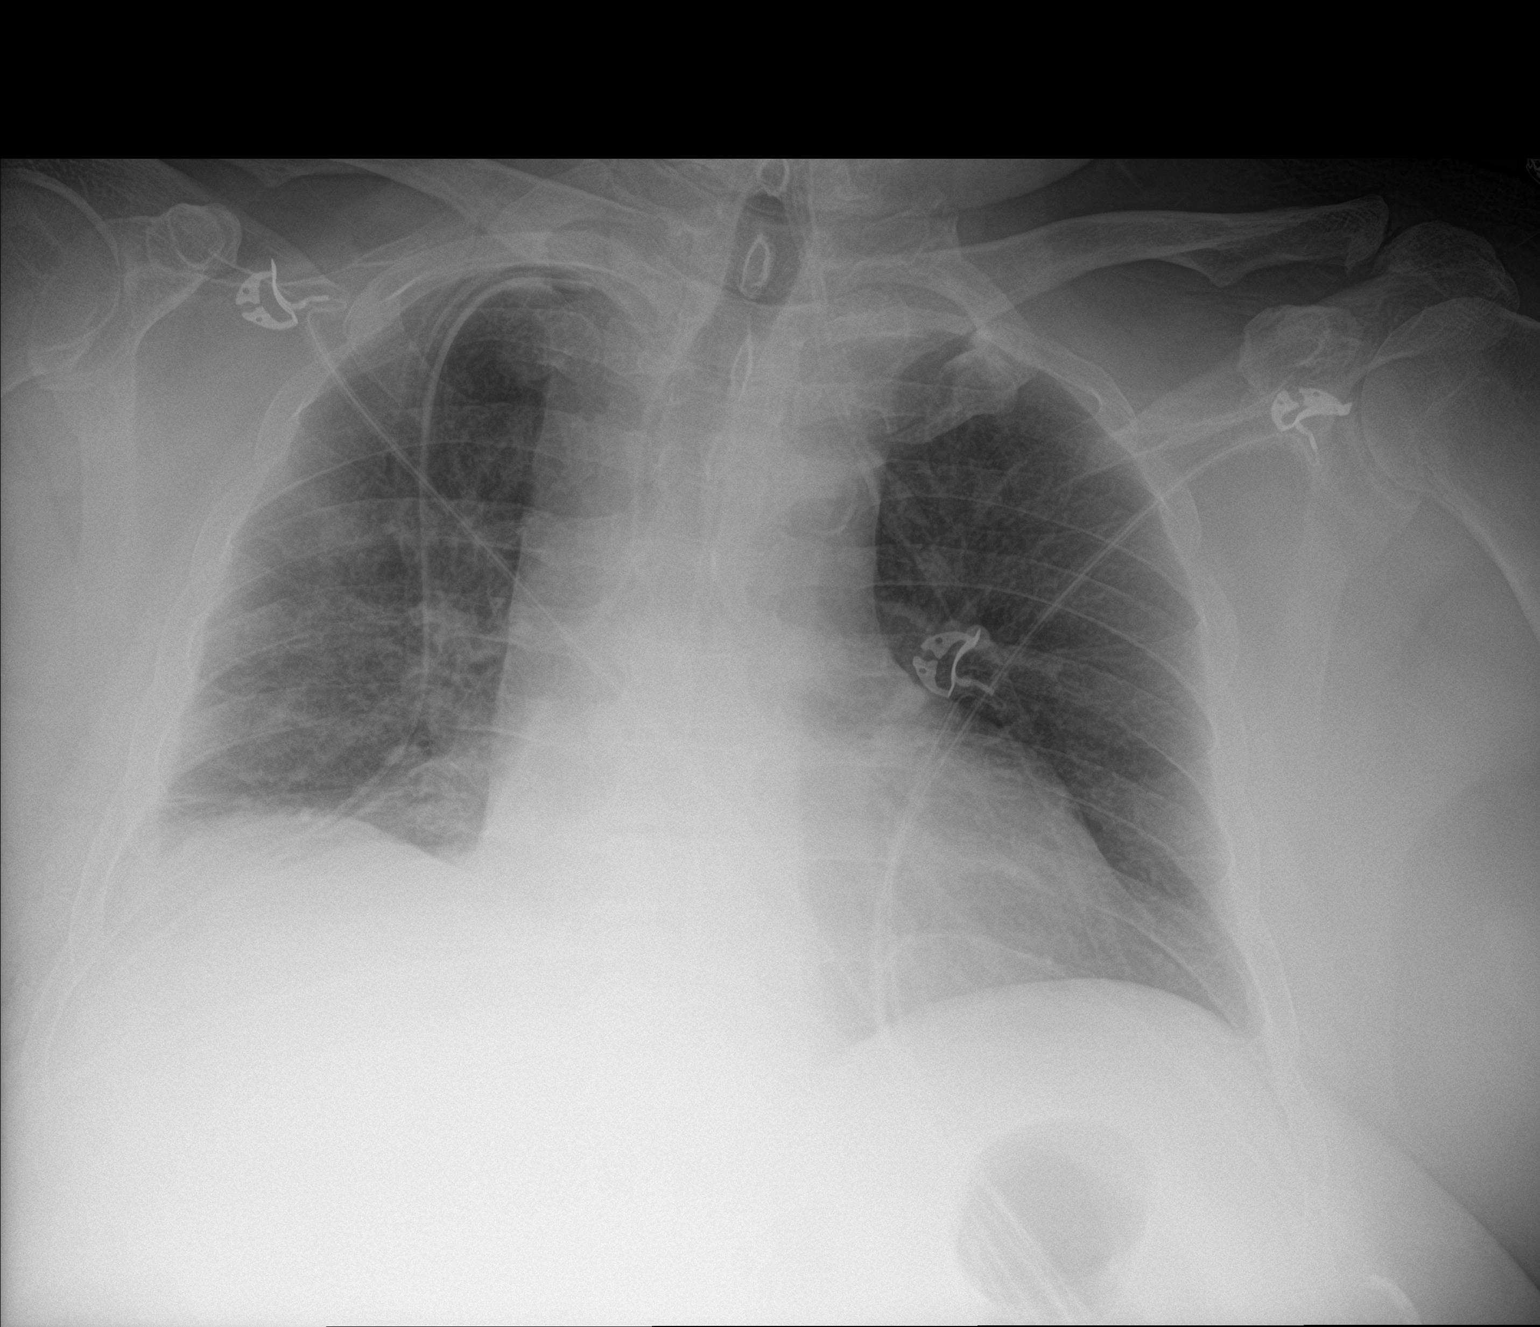

[2 of 2 positions shown; findings below may reference images not displayed]

FINDINGS: Stable cardiomediastinal silhouette. Stable position of right-sided
chest tube without definite pneumothorax. Left lung is clear. Mild
right basilar subsegmental atelectasis is noted. Bony thorax is
unremarkable.
IMPRESSION: Stable position of right-sided chest tube without definite
pneumothorax. Mild right basilar subsegmental atelectasis.

## 2022-11-21 IMAGING — DX DG CHEST 2V
2 series · 2 of 2 positions shown · non-contrast
Comparison: 10/05/2020.

CLINICAL DATA: Small right apical pneumothorax.

EXAM:
CHEST - 2 VIEW

[chest pa]
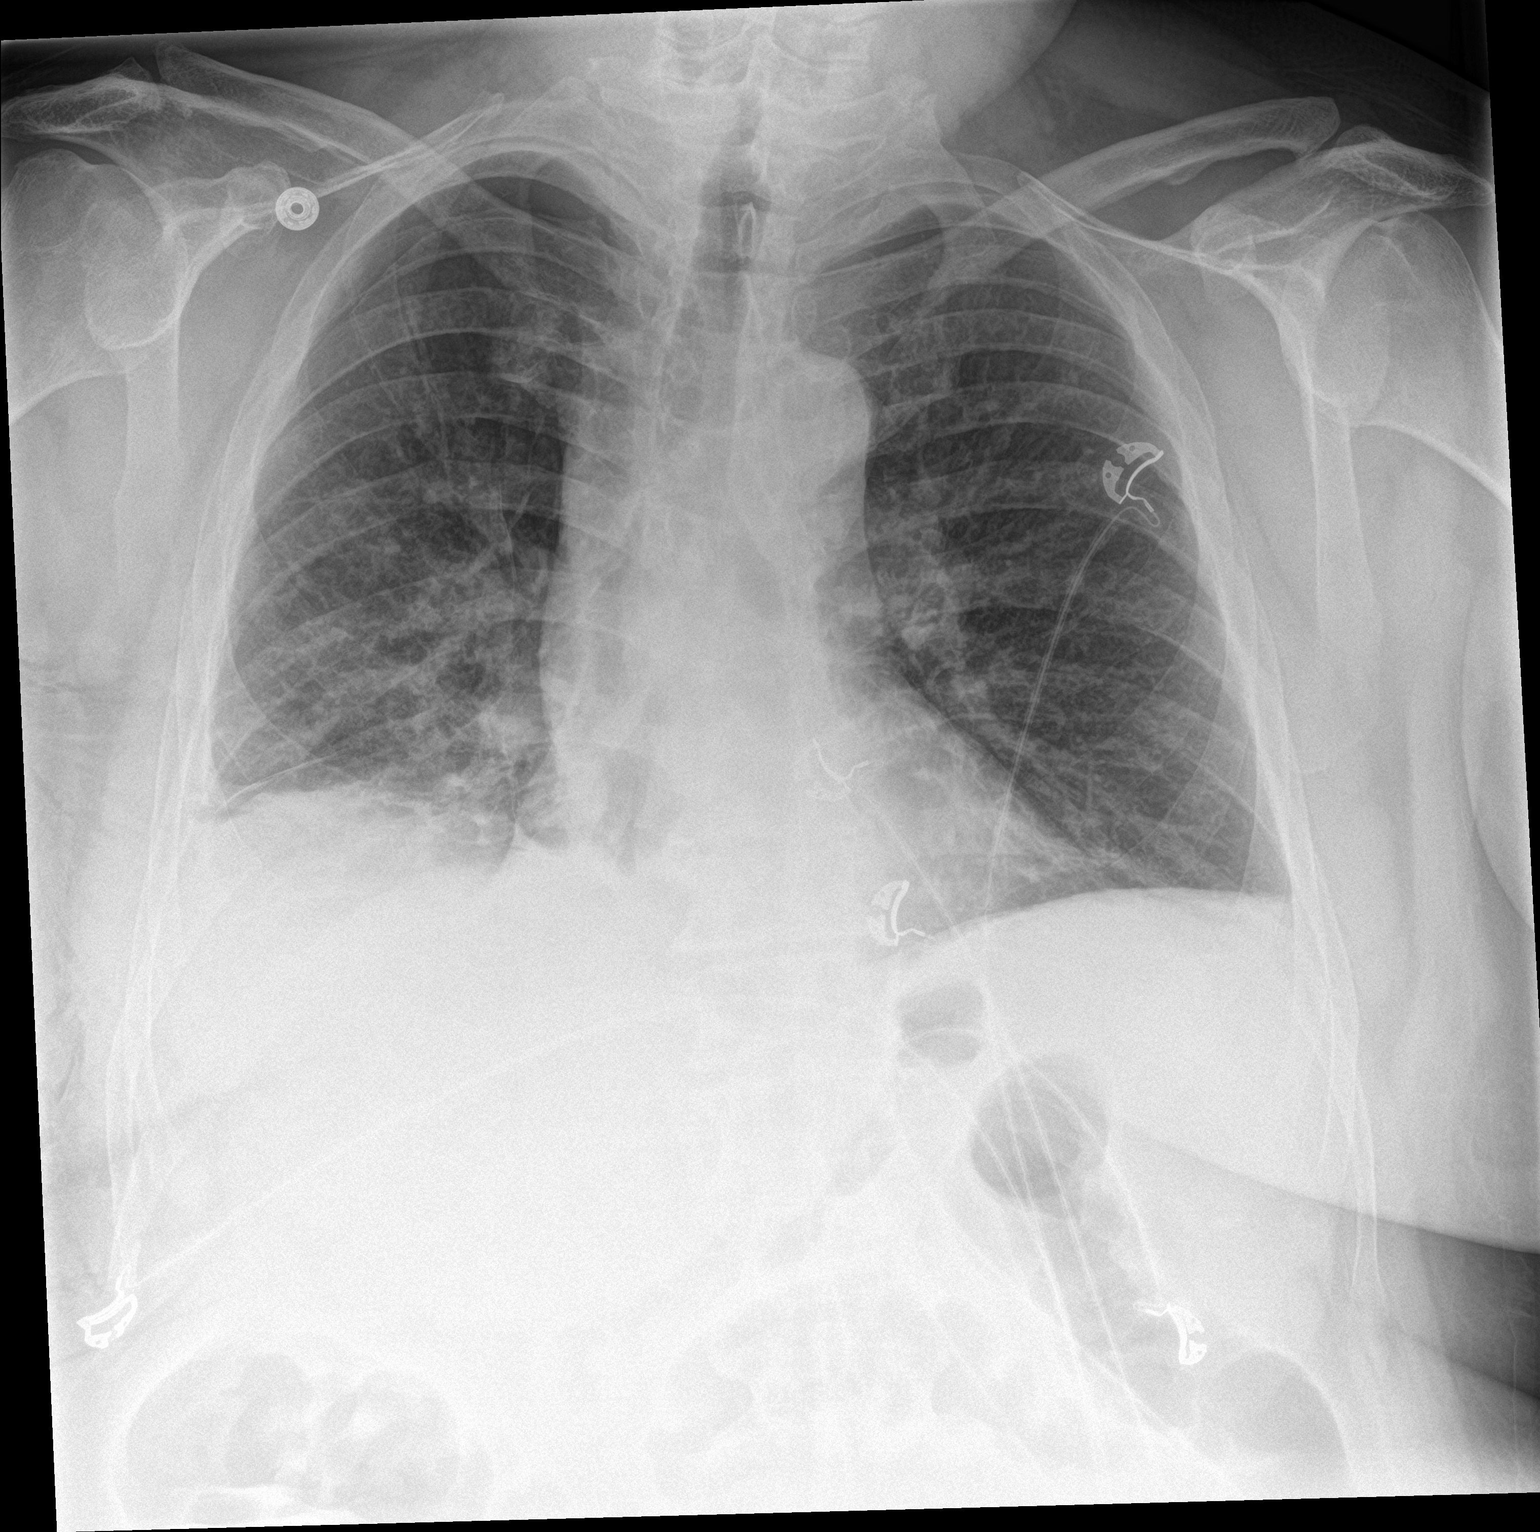

[chest lat]
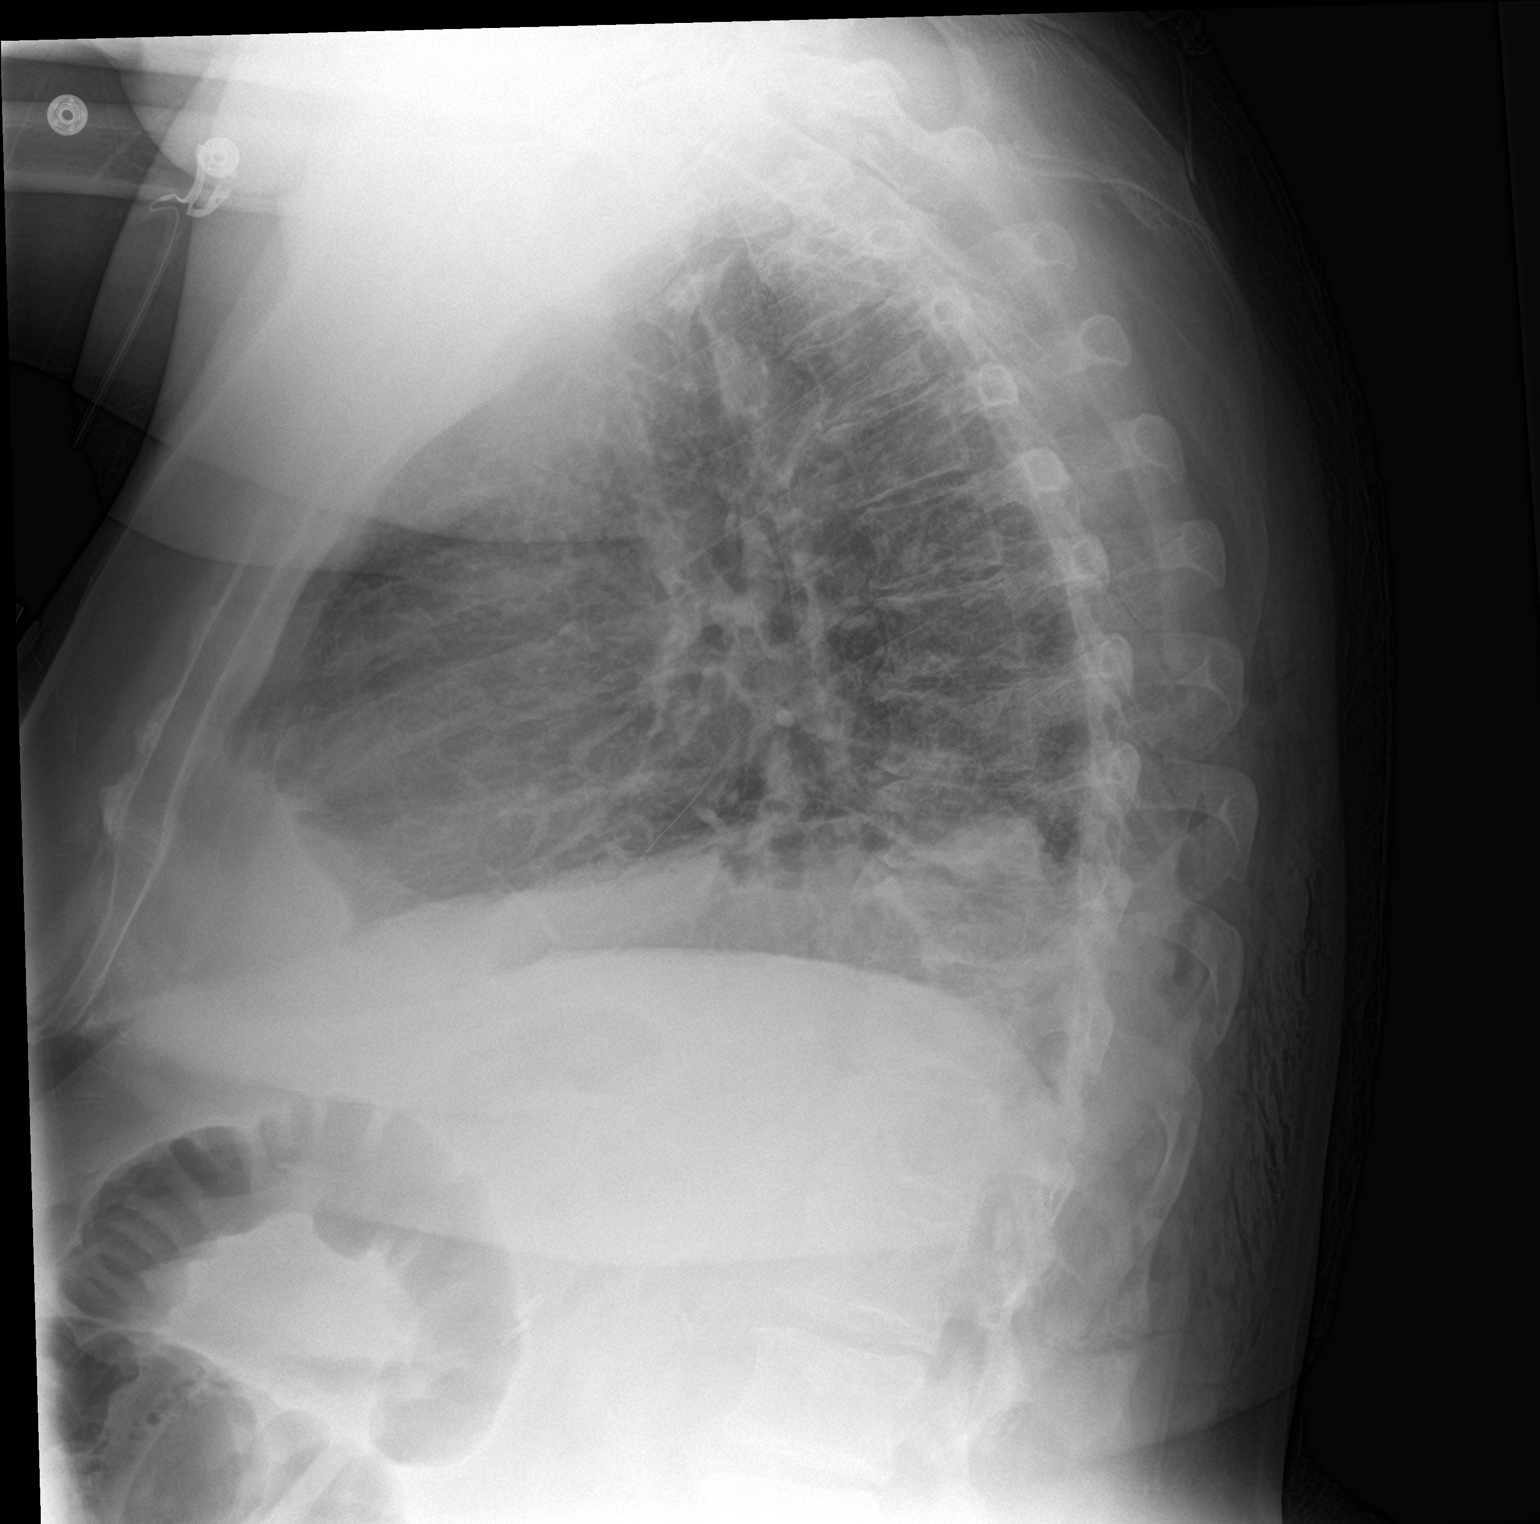

[2 of 2 positions shown; findings below may reference images not displayed]

FINDINGS: Mediastinum and hilar structures normal. Heart size normal. Right
apical pneumothorax appears unchanged. Associated small right base
pneumothorax may also be present. Low lung volumes with persistent
bibasilar atelectasis/infiltrates. Small right pleural effusion.
Heart size stable. Slightly dilated loops of bowel noted. Abdominal
series can be obtained to further evaluate.
IMPRESSION: 1. Right apical pneumothorax appears unchanged. Associated small
right base pneumothorax may also be present.

2. Low lung volumes with persistent bibasilar
atelectasis/infiltrates. Small right pleural effusion.

3. Slightly dilated loops of bowel noted. Abdominal series can be
obtained to further evaluate.

## 2023-03-24 IMAGING — CR DG CHEST 2V
2 series · 2 of 2 positions shown · non-contrast
Comparison: 11/21/2020

CLINICAL DATA: Follow-up of malignant carcinoid tumor of bronchus.
Asthma. Diabetes.

EXAM:
CHEST - 2 VIEW

[w chest pa]
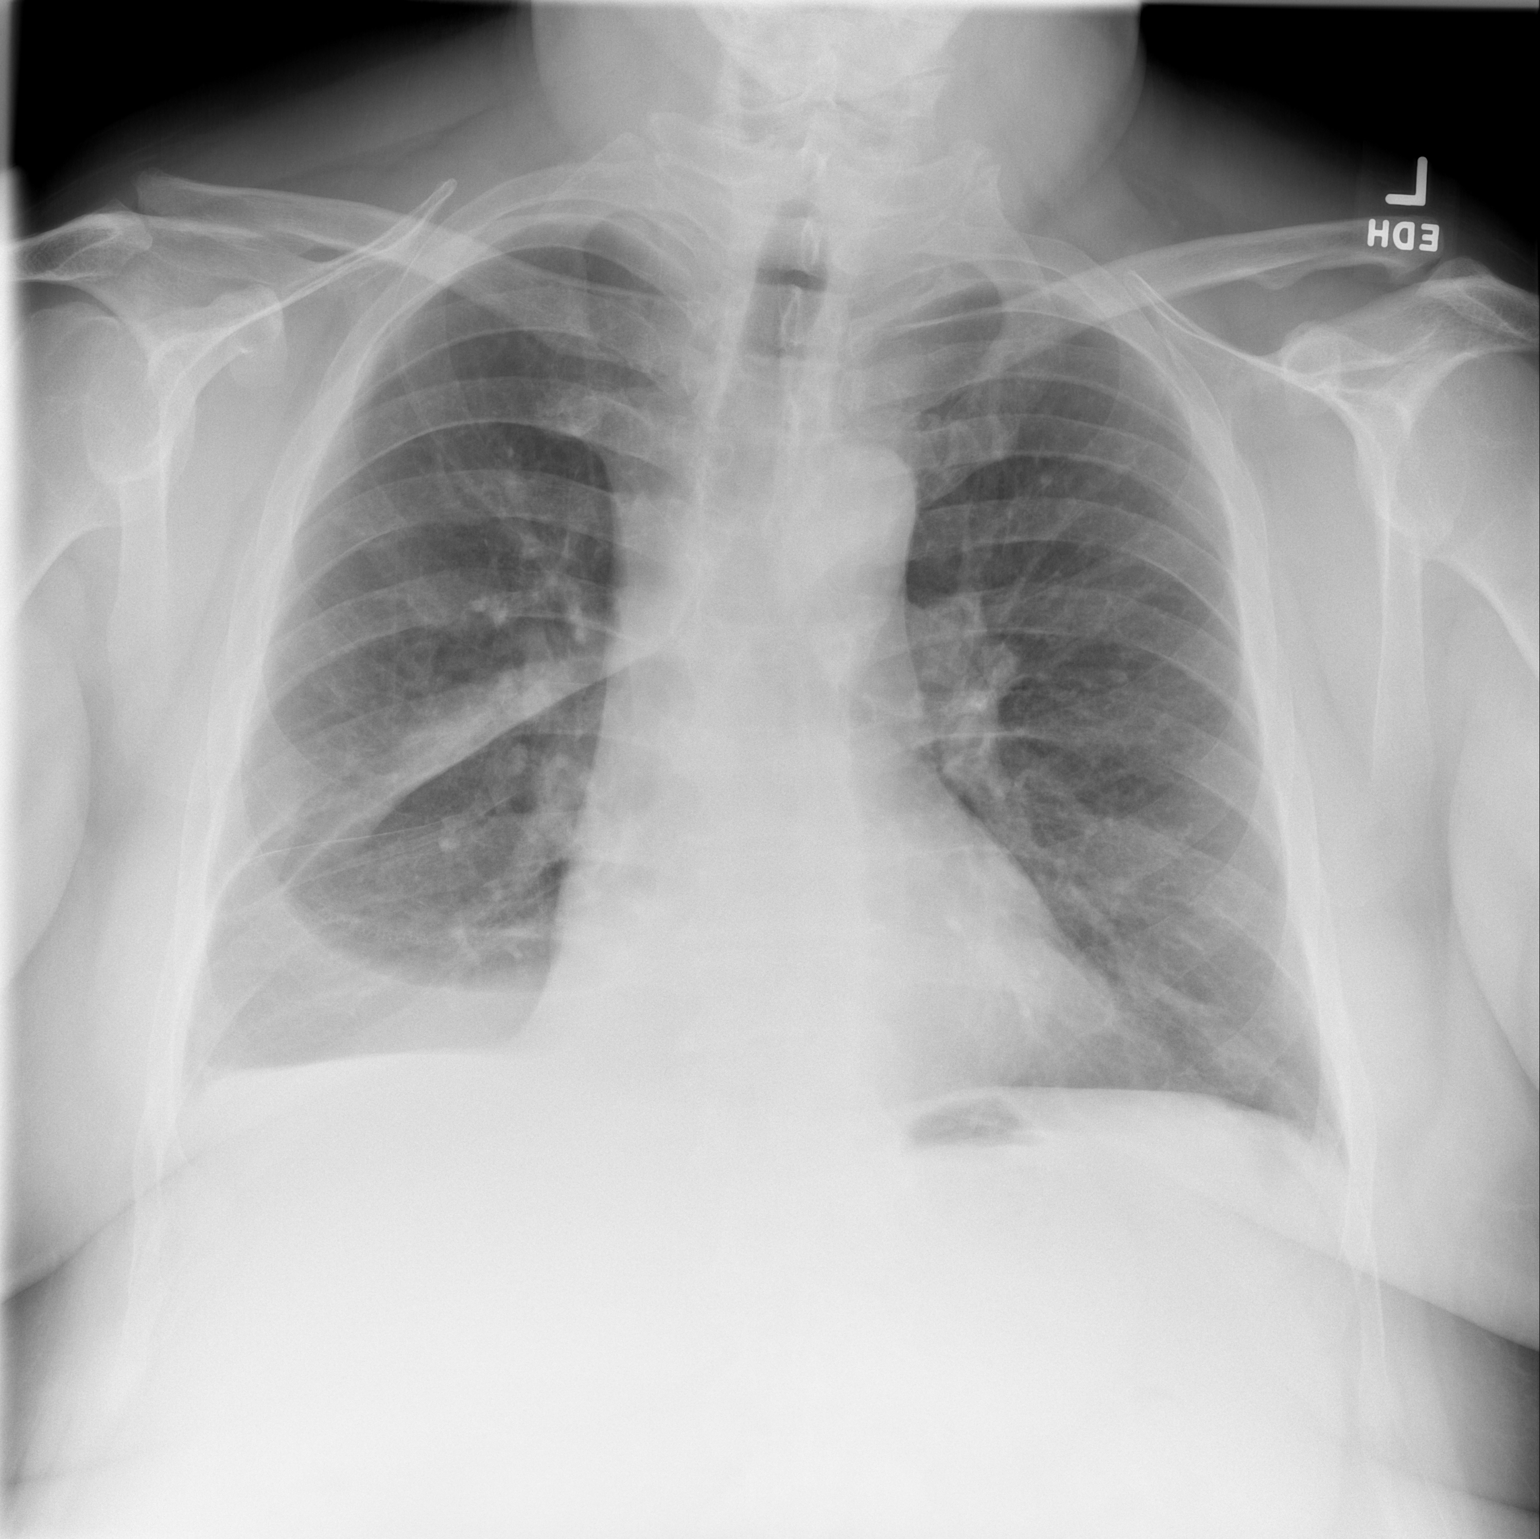

[w chest lat]
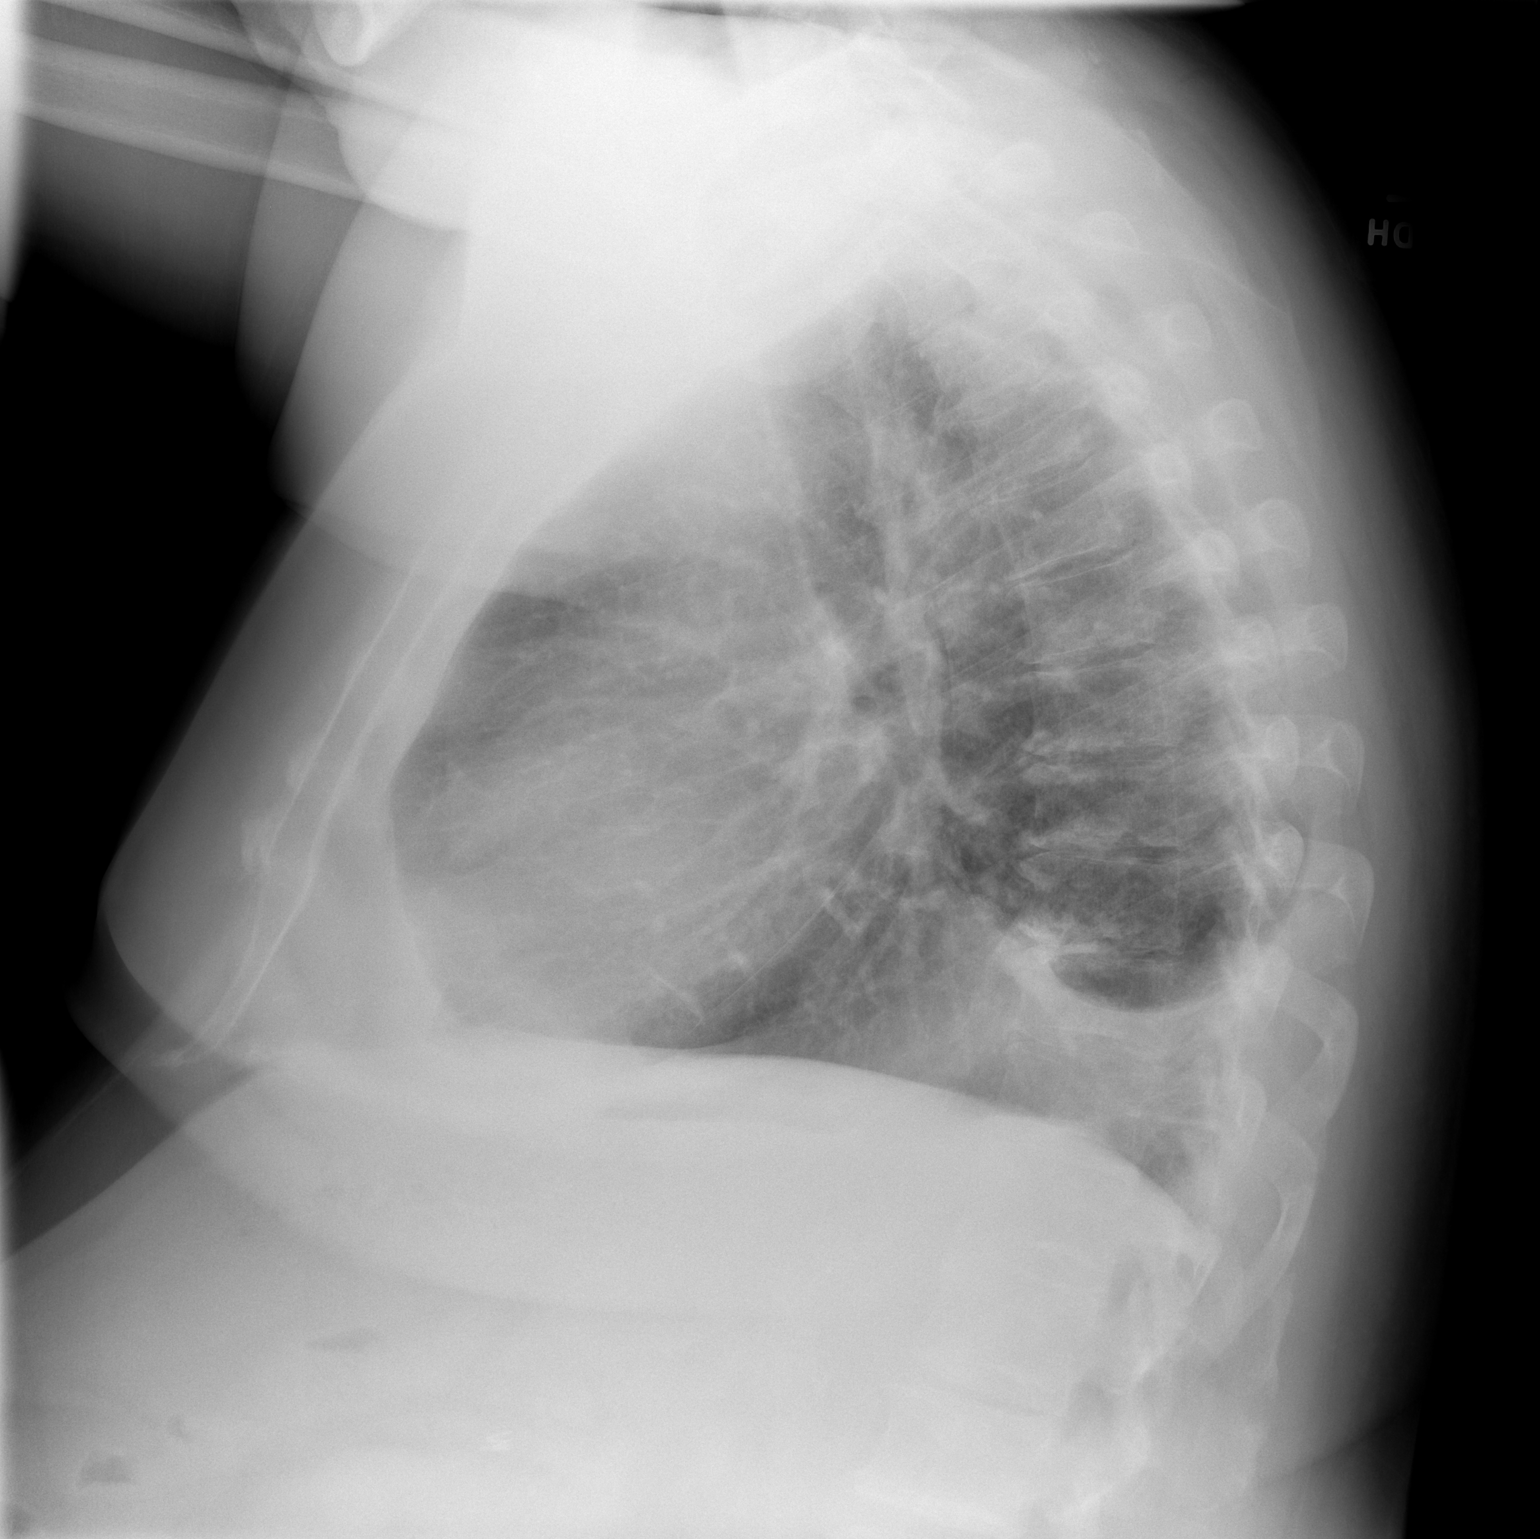

[2 of 2 positions shown; findings below may reference images not displayed]

FINDINGS: Midline trachea. Normal heart size. Small right pleural effusion is
slightly decreased. Volume loss and atelectasis at the right lung
base is also improved. Slight increase in inferior right upper lobe
volume loss and atelectasis.
IMPRESSION: Slightly improved right base aeration with decreased pleural fluid
and adjacent atelectasis. Minimal increase in inferior right upper
lobe volume loss/atelectasis.

## 2024-10-23 ENCOUNTER — Encounter (HOSPITAL_COMMUNITY): Payer: Self-pay | Admitting: Emergency Medicine

## 2024-10-23 ENCOUNTER — Emergency Department (HOSPITAL_COMMUNITY)
Admission: EM | Admit: 2024-10-23 | Discharge: 2024-10-23 | Disposition: A | Discharge status: Home / Self Care | Source: Home / Self Care | Attending: Emergency Medicine | Admitting: Emergency Medicine

## 2024-10-23 ENCOUNTER — Emergency Department (HOSPITAL_COMMUNITY)

## 2024-10-23 ENCOUNTER — Other Ambulatory Visit: Payer: Self-pay

## 2024-10-23 DIAGNOSIS — J101 Influenza due to other identified influenza virus with other respiratory manifestations: Secondary | ICD-10-CM | POA: Insufficient documentation

## 2024-10-23 DIAGNOSIS — Z7984 Long term (current) use of oral hypoglycemic drugs: Secondary | ICD-10-CM | POA: Insufficient documentation

## 2024-10-23 DIAGNOSIS — J45909 Unspecified asthma, uncomplicated: Secondary | ICD-10-CM | POA: Insufficient documentation

## 2024-10-23 DIAGNOSIS — I2510 Atherosclerotic heart disease of native coronary artery without angina pectoris: Secondary | ICD-10-CM | POA: Insufficient documentation

## 2024-10-23 DIAGNOSIS — E119 Type 2 diabetes mellitus without complications: Secondary | ICD-10-CM | POA: Insufficient documentation

## 2024-10-23 DIAGNOSIS — I10 Essential (primary) hypertension: Secondary | ICD-10-CM | POA: Insufficient documentation

## 2024-10-23 DIAGNOSIS — R000 Tachycardia, unspecified: Secondary | ICD-10-CM | POA: Insufficient documentation

## 2024-10-23 LAB — RESP PANEL BY RT-PCR (RSV, FLU A&B, COVID)  RVPGX2
Influenza A by PCR: POSITIVE — AB
Influenza B by PCR: NEGATIVE
Resp Syncytial Virus by PCR: NEGATIVE
SARS Coronavirus 2 by RT PCR: NEGATIVE

## 2024-10-23 LAB — BASIC METABOLIC PANEL WITH GFR
Anion gap: 9 (ref 5–15)
BUN: 16 mg/dL (ref 8–23)
CO2: 26 mmol/L (ref 22–32)
Calcium: 9.4 mg/dL (ref 8.9–10.3)
Chloride: 100 mmol/L (ref 98–111)
Creatinine, Ser: 1.07 mg/dL (ref 0.61–1.24)
GFR, Estimated: >60 mL/min
Glucose, Bld: 275 mg/dL — ABNORMAL HIGH (ref 70–99)
Potassium: 4.6 mmol/L (ref 3.5–5.1)
Sodium: 135 mmol/L (ref 135–145)

## 2024-10-23 LAB — CBC
HCT: 39.7 % (ref 39.0–52.0)
Hemoglobin: 13.1 g/dL (ref 13.0–17.0)
MCH: 28.9 pg (ref 26.0–34.0)
MCHC: 33 g/dL (ref 30.0–36.0)
MCV: 87.6 fL (ref 80.0–100.0)
Platelets: 161 K/uL (ref 150–400)
RBC: 4.53 MIL/uL (ref 4.22–5.81)
RDW: 14.5 % (ref 11.5–15.5)
WBC: 8.9 K/uL (ref 4.0–10.5)
nRBC: 0 % (ref 0.0–0.2)

## 2024-10-23 LAB — TROPONIN T, HIGH SENSITIVITY
Troponin T High Sensitivity: 26 ng/L — ABNORMAL HIGH (ref 0–19)
Troponin T High Sensitivity: 26 ng/L — ABNORMAL HIGH (ref 0–19)

## 2024-10-23 LAB — D-DIMER, QUANTITATIVE: D-Dimer, Quant: 0.41 ug{FEU}/mL (ref 0.00–0.50)

## 2024-10-23 MED ORDER — ONDANSETRON HCL 4 MG/2ML IJ SOLN
4.0000 mg | Freq: Once | INTRAMUSCULAR | Status: AC
  Administered 2024-10-23: 4 mg via INTRAVENOUS
  Filled 2024-10-23: qty 2

## 2024-10-23 MED ORDER — OSELTAMIVIR PHOSPHATE 75 MG PO CAPS: 75.0000 mg | ORAL_CAPSULE | Freq: Two times a day (BID) | ORAL | 0 refills | Status: AC

## 2024-10-23 MED ORDER — ACETAMINOPHEN 500 MG PO TABS
1000.0000 mg | ORAL_TABLET | Freq: Once | ORAL | Status: AC
  Administered 2024-10-23: 1000 mg via ORAL
  Filled 2024-10-23: qty 2

## 2024-10-23 MED ORDER — BENZONATATE 100 MG PO CAPS
200.0000 mg | ORAL_CAPSULE | Freq: Once | ORAL | Status: AC
  Administered 2024-10-23: 200 mg via ORAL
  Filled 2024-10-23: qty 2

## 2024-10-23 MED ORDER — BENZONATATE 100 MG PO CAPS: 200.0000 mg | ORAL_CAPSULE | Freq: Three times a day (TID) | ORAL | 0 refills | Status: AC | PRN

## 2024-10-23 MED ORDER — SODIUM CHLORIDE 0.9 % IV BOLUS
500.0000 mL | Freq: Once | INTRAVENOUS | Status: AC
  Administered 2024-10-23: 500 mL via INTRAVENOUS

## 2024-10-23 NOTE — ED Notes (Signed)
 ED Provider at bedside.

## 2024-10-23 NOTE — ED Triage Notes (Addendum)
 Pov c/o CP that started last night that woke pt up from sleep. Pt states has been coughing since yesterday with yellow phlegm. Pt last temp was 104 F at 0600 this morning. Pt took nitroglycerin and baby aspirin  last night for CP. Pt has taken 3 nitroglycerin in the last 45 minutes. Pt had stents placed this past September.

## 2024-10-23 NOTE — Discharge Instructions (Addendum)
 Rest and make sure you are drinking plenty of fluids to avoid dehydration.  Continue using your home medications, I have added Tamiflu  and Tessalon , the Tessalon  should help with your cough and the Tamiflu  may help your recovery be a little quicker and your flu symptoms be a little less severe.  As discussed you do need to get rechecked for any worsening symptoms including increasing weakness or shortness of breath.

## 2024-10-23 NOTE — ED Notes (Signed)
 Pt's daughter reports the Pt has been having increased confusion.  Sts this is very similar to when he had his issues before.

## 2024-10-23 NOTE — ED Notes (Signed)
 Pt verbalized understanding of discharge instructions. Opportunity for questions provided.

## 2024-10-23 NOTE — ED Provider Notes (Signed)
 " [place] EMERGENCY DEPARTMENT AT [facility] Provider Note   CSN: [id number] Arrival date & time: 10/23/24  1136     Patient presents with: Chest Pain   Jacob Orozco is a 63 y.o. 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  His temp this am was 104, he had known exposure to influenza yesterday morning at work.  He also endorses having left shoulder pain described as a burning sensation radiated into his neck, when this developed he took nitroglycerin x 3 and has had significant improvement in this pain.  He also took aspirin  324 mg prior to arrival. Currently endorses nausea,body aches,  subjective feer, no chest pain,  has a localized burning sensation left lateral shoulder.   The history is provided by the patient.       Prior to Admission medications  Medication Sig Start Date End Date Taking? Authorizing Provider  benzonatate  (TESSALON ) 100 MG capsule Take 2 capsules (200 mg total) by mouth 3 (three) times daily as needed. 10/23/24  Yes Florabel Faulks, PA-C  oseltamivir  (TAMIFLU ) 75 MG capsule Take 1 capsule (75 mg total) by mouth every 12 (twelve) hours. 10/23/24  Yes Lonnette Shrode, PA-C  acetaminophen  (TYLENOL ) 500 MG tablet Take 1 tablet (500 mg total) by mouth every 6 (six) hours as needed. 09/14/22   Nivia Colon, PA-C  amitriptyline  (ELAVIL ) 75 MG tablet Take 75 mg by mouth at bedtime as needed for sleep. 11/08/20   [provider]  amLODipine -valsartan  (EXFORGE ) 5-160 MG tablet Take 1 tablet by mouth daily.    [provider]  aspirin  EC 81 MG tablet Take 81  mg by mouth daily. Swallow whole.    [provider]  doxycycline  (VIBRAMYCIN ) 100 MG capsule Take 1 capsule (100 mg total) by mouth 2 (two) times daily. One po bid x 7 days 09/14/22   Nivia Colon, PA-C  metFORMIN  (GLUCOPHAGE -XR) 500 MG 24 hr tablet Take 500 mg by mouth in the morning and at bedtime.    [provider]    Allergies: Morphine , Penicillins, Ciprofloxacin , Cefdinir, Codeine, and Sulfa antibiotics    Review of Systems  Constitutional:  Positive for chills and fever.  HENT:  Negative for congestion and sore throat.   Eyes: Negative.   Respiratory:  Positive for cough and shortness of breath. Negative for chest tightness.   Cardiovascular:  Negative for chest pain and leg swelling.       Endorses chronic left leg swelling, not worse today  Gastrointestinal:  Positive for nausea. Negative for abdominal pain and vomiting.  Genitourinary: Negative.   Musculoskeletal:  Positive for arthralgias and myalgias. Negative for joint swelling and neck pain.  Skin: Negative.  Negative for rash and wound.  Neurological:  Negative for dizziness, weakness, light-headedness, numbness and headaches.  Psychiatric/Behavioral: Negative.      Updated Vital Signs BP 123/74 (BP Location: Left Arm)   Pulse 100   Temp 98.8 F (37.1 C) (Oral)   Resp 16   Ht 5' 10 (1.778 m)   Wt 93 kg   SpO2 95%   BMI 29.41 kg/m   Physical Exam Vitals and nursing note reviewed.  Constitutional:  Appearance: He is well-developed.  HENT:     Head: Normocephalic and atraumatic.  Eyes:     Conjunctiva/sclera: Conjunctivae normal.  Cardiovascular:     Rate and Rhythm: Regular rhythm. Tachycardia present.     Heart sounds: Normal heart sounds.  Pulmonary:     Effort: Pulmonary effort is normal. Tachypnea present.     Breath sounds: Decreased breath sounds present. No wheezing, rhonchi or rales.  Abdominal:     General: Bowel sounds are normal.     Palpations: Abdomen is soft.      Tenderness: There is no abdominal tenderness.  Musculoskeletal:        General: Normal range of motion.     Cervical back: Normal range of motion.     Comments: Left lower leg is visibly larger than right,  no edema,  no calf tenderness.  Skin:    General: Skin is warm and dry.  Neurological:     General: No focal deficit present.     Mental Status: He is alert.     (all labs ordered are listed, but only abnormal results are displayed) Labs Reviewed  RESP PANEL BY RT-PCR (RSV, FLU A&B, COVID)  RVPGX2 - Abnormal; Notable for the following components:      Result Value   Influenza A by PCR POSITIVE (*)    All other components within normal limits  BASIC METABOLIC PANEL WITH GFR - Abnormal; Notable for the following components:   Glucose, Bld 275 (*)    All other components within normal limits  TROPONIN T, HIGH SENSITIVITY - Abnormal; Notable for the following components:   Troponin T High Sensitivity 26 (*)    All other components within normal limits  TROPONIN T, HIGH SENSITIVITY - Abnormal; Notable for the following components:   Troponin T High Sensitivity 26 (*)    All other components within normal limits  CBC  D-DIMER, QUANTITATIVE    EKG: EKG Interpretation Date/Time:  Saturday October 23 2024 11:45:31 EST Ventricular Rate:  110 PR Interval:  148 QRS Duration:  126 QT Interval:  348 QTC Calculation: 470 R Axis:   16  Text Interpretation: Sinus tachycardia with occasional Premature ventricular complexes Left bundle branch block Abnormal ECG When compared with ECG of 22-Jan-2022 12:39, Premature ventricular complexes are now Present Vent. rate has increased BY  38 BPM Confirmed by Ellouise Fine ([id number]) on 10/23/2024 12:52:54 PM  Radiology: [facility] Chest Port 1 View Result Date: 10/23/2024 CLINICAL DATA:  Chest pain and cough. EXAM: PORTABLE CHEST 1 VIEW COMPARISON:  February 06, 2021 FINDINGS: The heart size and mediastinal contours are within normal limits. Mild  atelectasis is seen within the right lung base. No pleural effusion or pneumothorax is identified. The visualized skeletal structures are unremarkable. IMPRESSION: Mild right basilar atelectasis. Electronically Signed   By: Suzen Dials M.D.   On: 10/23/2024 13:27     Procedures   Medications Ordered in the ED  acetaminophen  (TYLENOL ) tablet 1,000 mg (1,000 mg Oral Given 10/23/24 1406)  sodium chloride  0.9 % bolus 500 mL (0 mLs Intravenous Stopped 10/23/24 1407)  ondansetron  (ZOFRAN ) injection 4 mg (4 mg Intravenous Given 10/23/24 1333)  benzonatate  (TESSALON ) capsule 200 mg (200 mg Oral Given 10/23/24 1744)                                    Medical Decision Making Patient presenting with flulike symptoms but also left  shoulder pain/chest pain with radiation into his left neck, he does have a history significant for CAD, differential diagnosis including viral illness including influenza, pneumonia, bronchitis, other viral URI, could also be associated with unstable angina given his past history.  Fever suggesting infectious source of symptoms.  Labs and imaging have been obtained and are reassuring although does confirm he has influenza A.  He was given IV fluids here also given Tylenol  Zofran  and Tessalon , he had episodic significant tachypnea although he denies shortness of breath, his oxygen saturation remained 93% or greater.  We discussed admission as a possible course given his tachypnea and his known influenza, patient is resistant to this plan.  We did ambulate him with a pulse ox and he did not desaturate nor did he become more tachypneic.  He felt stable for discharge home, he and his daughter both confirm that he will return if symptoms worsen in any way.  He was desirous of starting Tamiflu , this was prescribed along with Tessalon .  Amount and/or Complexity of Data Reviewed Labs: ordered.    Details: Significant labs including positive influenza A, his CBC is normal, be met  relatively unremarkable, he does have a glucose of 275, known diabetes, he has a normal D-dimer, he has a negative delta troponin, troponins x 2 are both flat at 26. Radiology: ordered.    Details: Imaging reviewed, no pneumonia, right basilar atelectasis which is mild. ECG/medicine tests: ordered.    Details: EKG sinus tachycardia with occasional PVC, rate 110 his pulse did improve after receiving IV fluids.  Risk OTC drugs. Prescription drug management.        Final diagnoses:  Influenza A    ED Discharge Orders          Ordered    benzonatate  (TESSALON ) 100 MG capsule  3 times daily PRN        10/23/24 1726    oseltamivir  (TAMIFLU ) 75 MG capsule  Every 12 hours        10/23/24 1726               Talita Recht, PA-C 10/23/24 1905  "

## 2024-10-24 ENCOUNTER — Other Ambulatory Visit: Payer: Self-pay

## 2024-10-24 ENCOUNTER — Encounter (HOSPITAL_COMMUNITY): Payer: Self-pay

## 2024-10-24 ENCOUNTER — Emergency Department (HOSPITAL_COMMUNITY)

## 2024-10-24 ENCOUNTER — Inpatient Hospital Stay (HOSPITAL_COMMUNITY)
Admission: EM | Admit: 2024-10-24 | Discharge: 2024-10-27 | DRG: 202 | Disposition: A | Discharge status: Home / Self Care | Attending: Internal Medicine | Admitting: Internal Medicine

## 2024-10-24 DIAGNOSIS — Z833 Family history of diabetes mellitus: Secondary | ICD-10-CM

## 2024-10-24 DIAGNOSIS — Z7984 Long term (current) use of oral hypoglycemic drugs: Secondary | ICD-10-CM

## 2024-10-24 DIAGNOSIS — Z8249 Family history of ischemic heart disease and other diseases of the circulatory system: Secondary | ICD-10-CM

## 2024-10-24 DIAGNOSIS — Z881 Allergy status to other antibiotic agents status: Secondary | ICD-10-CM

## 2024-10-24 DIAGNOSIS — Z882 Allergy status to sulfonamides status: Secondary | ICD-10-CM

## 2024-10-24 DIAGNOSIS — G4730 Sleep apnea, unspecified: Secondary | ICD-10-CM | POA: Diagnosis present

## 2024-10-24 DIAGNOSIS — I471 Supraventricular tachycardia, unspecified: Secondary | ICD-10-CM | POA: Diagnosis present

## 2024-10-24 DIAGNOSIS — J208 Acute bronchitis due to other specified organisms: Secondary | ICD-10-CM | POA: Diagnosis not present

## 2024-10-24 DIAGNOSIS — E1140 Type 2 diabetes mellitus with diabetic neuropathy, unspecified: Secondary | ICD-10-CM | POA: Diagnosis present

## 2024-10-24 DIAGNOSIS — J101 Influenza due to other identified influenza virus with other respiratory manifestations: Secondary | ICD-10-CM | POA: Diagnosis not present

## 2024-10-24 DIAGNOSIS — I110 Hypertensive heart disease with heart failure: Secondary | ICD-10-CM | POA: Diagnosis present

## 2024-10-24 DIAGNOSIS — J449 Chronic obstructive pulmonary disease, unspecified: Secondary | ICD-10-CM | POA: Diagnosis present

## 2024-10-24 DIAGNOSIS — I2510 Atherosclerotic heart disease of native coronary artery without angina pectoris: Secondary | ICD-10-CM | POA: Diagnosis present

## 2024-10-24 DIAGNOSIS — Z885 Allergy status to narcotic agent status: Secondary | ICD-10-CM

## 2024-10-24 DIAGNOSIS — E1142 Type 2 diabetes mellitus with diabetic polyneuropathy: Secondary | ICD-10-CM

## 2024-10-24 DIAGNOSIS — G4733 Obstructive sleep apnea (adult) (pediatric): Secondary | ICD-10-CM | POA: Diagnosis not present

## 2024-10-24 DIAGNOSIS — Z7902 Long term (current) use of antithrombotics/antiplatelets: Secondary | ICD-10-CM

## 2024-10-24 DIAGNOSIS — Z7982 Long term (current) use of aspirin: Secondary | ICD-10-CM

## 2024-10-24 DIAGNOSIS — J111 Influenza due to unidentified influenza virus with other respiratory manifestations: Principal | ICD-10-CM

## 2024-10-24 DIAGNOSIS — I10 Essential (primary) hypertension: Secondary | ICD-10-CM | POA: Diagnosis present

## 2024-10-24 DIAGNOSIS — E119 Type 2 diabetes mellitus without complications: Secondary | ICD-10-CM

## 2024-10-24 DIAGNOSIS — F17210 Nicotine dependence, cigarettes, uncomplicated: Secondary | ICD-10-CM | POA: Diagnosis present

## 2024-10-24 DIAGNOSIS — Z79899 Other long term (current) drug therapy: Secondary | ICD-10-CM

## 2024-10-24 DIAGNOSIS — Z888 Allergy status to other drugs, medicaments and biological substances status: Secondary | ICD-10-CM

## 2024-10-24 DIAGNOSIS — Z823 Family history of stroke: Secondary | ICD-10-CM

## 2024-10-24 DIAGNOSIS — Z1152 Encounter for screening for COVID-19: Secondary | ICD-10-CM

## 2024-10-24 DIAGNOSIS — I447 Left bundle-branch block, unspecified: Secondary | ICD-10-CM | POA: Diagnosis present

## 2024-10-24 DIAGNOSIS — E1165 Type 2 diabetes mellitus with hyperglycemia: Secondary | ICD-10-CM | POA: Diagnosis present

## 2024-10-24 DIAGNOSIS — I5041 Acute combined systolic (congestive) and diastolic (congestive) heart failure: Secondary | ICD-10-CM | POA: Diagnosis present

## 2024-10-24 DIAGNOSIS — T380X5A Adverse effect of glucocorticoids and synthetic analogues, initial encounter: Secondary | ICD-10-CM | POA: Diagnosis present

## 2024-10-24 DIAGNOSIS — J440 Chronic obstructive pulmonary disease with acute lower respiratory infection: Secondary | ICD-10-CM | POA: Diagnosis present

## 2024-10-24 DIAGNOSIS — Z955 Presence of coronary angioplasty implant and graft: Secondary | ICD-10-CM

## 2024-10-24 LAB — BASIC METABOLIC PANEL WITH GFR
Anion gap: 15 (ref 5–15)
BUN: 23 mg/dL (ref 8–23)
CO2: 19 mmol/L — ABNORMAL LOW (ref 22–32)
Calcium: 9 mg/dL (ref 8.9–10.3)
Chloride: 101 mmol/L (ref 98–111)
Creatinine, Ser: 1.08 mg/dL (ref 0.61–1.24)
GFR, Estimated: >60 mL/min
Glucose, Bld: 255 mg/dL — ABNORMAL HIGH (ref 70–99)
Potassium: 4.7 mmol/L (ref 3.5–5.1)
Sodium: 134 mmol/L — ABNORMAL LOW (ref 135–145)

## 2024-10-24 LAB — CBC WITH DIFFERENTIAL/PLATELET
Abs Immature Granulocytes: 0.04 K/uL (ref 0.00–0.07)
Basophils Absolute: 0 K/uL (ref 0.0–0.1)
Basophils Relative: 1 %
Eosinophils Absolute: 0.2 K/uL (ref 0.0–0.5)
Eosinophils Relative: 2 %
HCT: 39.4 % (ref 39.0–52.0)
Hemoglobin: 13.1 g/dL (ref 13.0–17.0)
Immature Granulocytes: 1 %
Lymphocytes Relative: 19 %
Lymphs Abs: 1.7 K/uL (ref 0.7–4.0)
MCH: 29.5 pg (ref 26.0–34.0)
MCHC: 33.2 g/dL (ref 30.0–36.0)
MCV: 88.7 fL (ref 80.0–100.0)
Monocytes Absolute: 1.2 K/uL — ABNORMAL HIGH (ref 0.1–1.0)
Monocytes Relative: 14 %
Neutro Abs: 5.7 K/uL (ref 1.7–7.7)
Neutrophils Relative %: 63 %
Platelets: 153 K/uL (ref 150–400)
RBC: 4.44 MIL/uL (ref 4.22–5.81)
RDW: 14.6 % (ref 11.5–15.5)
WBC: 8.9 K/uL (ref 4.0–10.5)
nRBC: 0 % (ref 0.0–0.2)

## 2024-10-24 LAB — TROPONIN T, HIGH SENSITIVITY
Troponin T High Sensitivity: 28 ng/L — ABNORMAL HIGH (ref 0–19)
Troponin T High Sensitivity: 27 ng/L — ABNORMAL HIGH (ref 0–19)

## 2024-10-24 LAB — GLUCOSE, CAPILLARY: Glucose-Capillary: 196 mg/dL — ABNORMAL HIGH (ref 70–99)

## 2024-10-24 MED ORDER — ONDANSETRON HCL 4 MG PO TABS: 4.0000 mg | ORAL_TABLET | Freq: Four times a day (QID) | ORAL | Status: DC | PRN

## 2024-10-24 MED ORDER — ENOXAPARIN SODIUM 40 MG/0.4ML IJ SOSY
40.0000 mg | PREFILLED_SYRINGE | INTRAMUSCULAR | Status: DC
  Administered 2024-10-24 – 2024-10-26 (×3): 40 mg via SUBCUTANEOUS
  Filled 2024-10-24: qty 0.4

## 2024-10-24 MED ORDER — IRBESARTAN 150 MG PO TABS
150.0000 mg | ORAL_TABLET | Freq: Every day | ORAL | Status: DC
  Administered 2024-10-25: 150 mg via ORAL

## 2024-10-24 MED ORDER — INSULIN ASPART 100 UNIT/ML IJ SOLN: 0.0000 [IU] | Freq: Every day | INTRAMUSCULAR | Status: DC

## 2024-10-24 MED ORDER — ACETAMINOPHEN 325 MG PO TABS
650.0000 mg | ORAL_TABLET | Freq: Four times a day (QID) | ORAL | Status: DC | PRN
  Administered 2024-10-25 – 2024-10-26 (×2): 650 mg via ORAL

## 2024-10-24 MED ORDER — OSELTAMIVIR PHOSPHATE 75 MG PO CAPS
75.0000 mg | ORAL_CAPSULE | Freq: Two times a day (BID) | ORAL | Status: DC
  Administered 2024-10-24 – 2024-10-27 (×6): 75 mg via ORAL
  Filled 2024-10-24: qty 1

## 2024-10-24 MED ORDER — ONDANSETRON HCL 4 MG/2ML IJ SOLN: 4.0000 mg | Freq: Four times a day (QID) | INTRAMUSCULAR | Status: DC | PRN

## 2024-10-24 MED ORDER — TICAGRELOR 90 MG PO TABS
90.0000 mg | ORAL_TABLET | Freq: Two times a day (BID) | ORAL | Status: DC
  Administered 2024-10-24 – 2024-10-27 (×6): 90 mg via ORAL
  Filled 2024-10-24: qty 1

## 2024-10-24 MED ORDER — INSULIN ASPART 100 UNIT/ML IJ SOLN
0.0000 [IU] | Freq: Three times a day (TID) | INTRAMUSCULAR | Status: DC
  Administered 2024-10-25: 15 [IU] via SUBCUTANEOUS

## 2024-10-24 MED ORDER — ENSURE PLUS HIGH PROTEIN PO LIQD
237.0000 mL | Freq: Two times a day (BID) | ORAL | Status: DC
  Administered 2024-10-25 – 2024-10-27 (×3): 237 mL via ORAL

## 2024-10-24 MED ORDER — METHYLPREDNISOLONE SODIUM SUCC 125 MG IJ SOLR
60.0000 mg | Freq: Two times a day (BID) | INTRAMUSCULAR | Status: DC
  Administered 2024-10-24 – 2024-10-25 (×2): 60 mg via INTRAVENOUS
  Filled 2024-10-24: qty 2

## 2024-10-24 MED ORDER — IPRATROPIUM-ALBUTEROL 0.5-2.5 (3) MG/3ML IN SOLN
3.0000 mL | Freq: Three times a day (TID) | RESPIRATORY_TRACT | Status: AC
  Administered 2024-10-25 (×3): 3 mL via RESPIRATORY_TRACT

## 2024-10-24 MED ORDER — ISOSORBIDE MONONITRATE ER 30 MG PO TB24
30.0000 mg | ORAL_TABLET | Freq: Every day | ORAL | Status: DC
  Administered 2024-10-25 – 2024-10-27 (×3): 30 mg via ORAL

## 2024-10-24 MED ORDER — AMLODIPINE BESYLATE-VALSARTAN 5-160 MG PO TABS: 1.0000 | ORAL_TABLET | Freq: Every day | ORAL | Status: DC

## 2024-10-24 MED ORDER — ASPIRIN 81 MG PO TBEC
81.0000 mg | DELAYED_RELEASE_TABLET | Freq: Every day | ORAL | Status: DC
  Administered 2024-10-25 – 2024-10-27 (×3): 81 mg via ORAL

## 2024-10-24 MED ORDER — AMITRIPTYLINE HCL 25 MG PO TABS
75.0000 mg | ORAL_TABLET | Freq: Every evening | ORAL | Status: DC | PRN
  Administered 2024-10-26: 75 mg via ORAL

## 2024-10-24 MED ORDER — IPRATROPIUM-ALBUTEROL 0.5-2.5 (3) MG/3ML IN SOLN
3.0000 mL | RESPIRATORY_TRACT | Status: DC | PRN
  Administered 2024-10-26: 3 mL via RESPIRATORY_TRACT

## 2024-10-24 MED ORDER — IPRATROPIUM-ALBUTEROL 0.5-2.5 (3) MG/3ML IN SOLN
3.0000 mL | Freq: Three times a day (TID) | RESPIRATORY_TRACT | Status: DC
  Administered 2024-10-24: 3 mL via RESPIRATORY_TRACT
  Filled 2024-10-24: qty 3

## 2024-10-24 MED ORDER — ACETAMINOPHEN 650 MG RE SUPP: 650.0000 mg | Freq: Four times a day (QID) | RECTAL | Status: DC | PRN

## 2024-10-24 MED ORDER — LORAZEPAM 0.5 MG PO TABS
0.5000 mg | ORAL_TABLET | Freq: Every day | ORAL | Status: AC
  Administered 2024-10-24 – 2024-10-25 (×2): 0.5 mg via ORAL
  Filled 2024-10-24: qty 1

## 2024-10-24 MED ORDER — POLYETHYLENE GLYCOL 3350 17 G PO PACK: 17.0000 g | PACK | Freq: Every day | ORAL | Status: DC | PRN

## 2024-10-24 MED ORDER — AMLODIPINE BESYLATE 5 MG PO TABS
5.0000 mg | ORAL_TABLET | Freq: Every day | ORAL | Status: DC
  Administered 2024-10-25 – 2024-10-27 (×3): 5 mg via ORAL

## 2024-10-24 MED ORDER — METOPROLOL SUCCINATE ER 50 MG PO TB24
50.0000 mg | ORAL_TABLET | Freq: Every day | ORAL | Status: DC
  Administered 2024-10-25 – 2024-10-27 (×3): 50 mg via ORAL

## 2024-10-24 NOTE — ED Triage Notes (Signed)
 Pt was seen in the ED yesterday and diagnosed with influenza A. He is back today because he continues to feel ShOB and was unable to sleep last night, He has been taking 500 mg of APAP every 4 hours for fever.

## 2024-10-24 NOTE — H&P (Addendum)
 " History and Physical    Jacob Orozco FMW:[id number] DOB: 1961-08-29 DOA: 10/24/2024  PCP: Marchelle Clem Pitts, MD   Patient coming from: Home  I have personally briefly reviewed patient's old medical records in [facility]  Chief Complaint:   HPI: Jacob Orozco is a 63 y.o. male with medical history significant for coronary artery disease, COPD, diabetes mellitus, hypertension, OSA on CPAP Patient presented to the ED with complaints of difficulty breathing, cough productive of yellowish sputum, generalized bodyaches that started 3 days ago.  He reports some mild chest pain associated with difficulty breathing, around his chest, worse with coughing.  He got the flu vaccine about 3 weeks ago.   He was in the ED yesterday for similar symptoms, he was diagnosed with the flu, he opted to go home, was started on a course of Tamiflu . He reports since getting home, he has not been able to sleep for several days, he has had poor appetite no vomiting no diarrhea.  Today he checked his O2 sats and they were in the high 80s so he came to the ED.  ED Course: Tmax 99.1.  O2 sats 94 to 100% on room air.  Respiratory rate 23-35.  Blood pressure systolic 122-148. Troponin 28 >> 27. Chest x-ray shows viral bronchitis versus reactive airway disease. Hospitalist to admit for acute bronchitis.  Review of Systems: As per HPI all other systems reviewed and negative.  Past Medical History:  Diagnosis Date   Asthma    Carcinoid tumor (HCC)    Diabetes mellitus without complication (HCC)    Hypertension    Sleep apnea    cpap    Past Surgical History:  Procedure Laterality Date   APPENDECTOMY     CARDIAC SURGERY     stents; September 2025   CHOLECYSTECTOMY     INTERCOSTAL NERVE BLOCK Right 10/02/2020   Procedure: INTERCOSTAL NERVE BLOCK;  Surgeon: Kerrin Elspeth BROCKS, MD;  Location: [facility] OR;  Service: Thoracic;  Laterality: Right;   LUNG SEGMENTECTOMY Right 10/02/2020   XI ROBOTIC  ASSISTED THORASCOPY-RIGHT LOWER    SVT ABLATION N/A 01/22/2022   Procedure: SVT ABLATION;  Surgeon: Inocencio Soyla Lunger, MD;  Location: [facility] INVASIVE [facility] LAB;  Service: Cardiovascular;  Laterality: N/A;   thorascopy Right 10/02/2020   XI ROBOTIC ASSISTED THORASCOPY-RIGHT LOWER    TONSILLECTOMY       reports that he has been smoking cigarettes. He has been smoking an average of 0.5 packs per day. He has never used smokeless tobacco. He reports that he does not drink alcohol and does not use drugs.  Allergies[1]  Family History  Problem Relation Age of Onset   Pulmonary fibrosis Mother    Diabetes Mother    Coronary artery disease Father    Stroke Father    Diabetes Father    Hypertrophic cardiomyopathy Brother     Prior to Admission medications  Medication Sig Start Date End Date Taking? Authorizing Provider  acetaminophen  (TYLENOL ) 500 MG tablet Take 1 tablet (500 mg total) by mouth every 6 (six) hours as needed. 09/14/22   Nivia Colon, PA-C  amitriptyline  (ELAVIL ) 75 MG tablet Take 75 mg by mouth at bedtime as needed for sleep. 11/08/20   [provider]  amLODipine -valsartan  (EXFORGE ) 5-160 MG tablet Take 1 tablet by mouth daily.    [provider]  aspirin  EC 81 MG tablet Take 81 mg by mouth daily. Swallow whole.    [provider]  benzonatate  (TESSALON ) 100  MG capsule Take 2 capsules (200 mg total) by mouth 3 (three) times daily as needed. 10/23/24   Idol, Julie, PA-C  doxycycline  (VIBRAMYCIN ) 100 MG capsule Take 1 capsule (100 mg total) by mouth 2 (two) times daily. One po bid x 7 days 09/14/22   Nivia Colon, PA-C  metFORMIN  (GLUCOPHAGE -XR) 500 MG 24 hr tablet Take 500 mg by mouth in the morning and at bedtime.    [provider]  oseltamivir  (TAMIFLU ) 75 MG capsule Take 1 capsule (75 mg total) by mouth every 12 (twelve) hours. 10/23/24   Birdena Clarity, PA-C    Physical Exam: Vitals:   10/24/24 1600 10/24/24 1615 10/24/24 1630 10/24/24 1738   BP: 127/84 (!) 146/58 (!) 148/126 122/68  Pulse: 91 90 92   Resp: (!) 28 (!) 34 (!) 25   Temp:      TempSrc:      SpO2: 95% 100% 94%   Weight:      Height:        Constitutional: NAD, calm, comfortable Vitals:   10/24/24 1600 10/24/24 1615 10/24/24 1630 10/24/24 1738  BP: 127/84 (!) 146/58 (!) 148/126 122/68  Pulse: 91 90 92   Resp: (!) 28 (!) 34 (!) 25   Temp:      TempSrc:      SpO2: 95% 100% 94%   Weight:      Height:       Eyes: PERRL, lids and conjunctivae normal ENMT: Mucous membranes are moist. Neck: normal, supple, no masses, no thyromegaly Respiratory: clear to auscultation bilaterally, no wheezing, no crackles. Normal respiratory effort. No accessory muscle use.  Cardiovascular: Regular rate and rhythm, no murmurs / rubs / gallops. Left LE larger than right -chronic and unchanged from baseline.   Abdomen: no tenderness, no masses palpated. No hepatosplenomegaly.   Musculoskeletal: no clubbing / cyanosis. No joint deformity upper and lower extremities.  Skin: no rashes, lesions, ulcers. No induration Neurologic: No facial asymmetry, moving extremity spontaneously, speech fluent. Psychiatric: Normal judgment and insight. Alert and oriented x 3. Normal mood.   Labs on Admission: I have personally reviewed following labs and imaging studies  CBC: Recent Labs  Lab 10/23/24 1200 10/24/24 1425  WBC 8.9 8.9  NEUTROABS  --  5.7  HGB 13.1 13.1  HCT 39.7 39.4  MCV 87.6 88.7  PLT 161 153   Basic Metabolic Panel: Recent Labs  Lab 10/23/24 1200 10/24/24 1425  NA 135 134*  K 4.6 4.7  CL 100 101  CO2 26 19*  GLUCOSE 275* 255*  BUN 16 23  CREATININE 1.07 1.08  CALCIUM  9.4 9.0    Radiological Exams on Admission: DG Chest 2 View Result Date: 10/24/2024 CLINICAL DATA:  Recently diagnosed with the flu presenting with shortness of breath. EXAM: CHEST - 2 VIEW COMPARISON:  None Available. FINDINGS: The heart size and mediastinal contours are within normal limits.  There is mild bilateral peribronchial cuffing with increased suprahilar and infrahilar lung markings noted. Mild atelectasis is also seen within the bilateral lung bases. No pleural effusion or pneumothorax is identified. The visualized skeletal structures are unremarkable. IMPRESSION: 1. Findings consistent with viral bronchitis versus reactive airway disease. 2. Mild bibasilar atelectasis. Electronically Signed   By: Suzen Dials M.D.   On: 10/24/2024 15:40   DG Chest Port 1 View Result Date: 10/23/2024 CLINICAL DATA:  Chest pain and cough. EXAM: PORTABLE CHEST 1 VIEW COMPARISON:  February 06, 2021 FINDINGS: The heart size and mediastinal contours are within normal limits.  Mild atelectasis is seen within the right lung base. No pleural effusion or pneumothorax is identified. The visualized skeletal structures are unremarkable. IMPRESSION: Mild right basilar atelectasis. Electronically Signed   By: Suzen Dials M.D.   On: 10/23/2024 13:27   EKG: Independently reviewed.  Sinus rhythm , rate 90, QTc 404.  LBBB.  Assessment/Plan Principal Problem:   Acute bronchitis, viral Active Problems:   Sleep apnea   Essential hypertension   COPD (chronic obstructive pulmonary disease) (HCC)   Type 2 diabetes mellitus (HCC)   Diabetic neuropathy associated with type 2 diabetes mellitus (HCC)   SVT (supraventricular tachycardia)   Influenza A  Assessment and Plan:  Acute bronchitis 2/2 influenza A- reported hypoxia at home, O2 sat > 94% on room air here.  Presenting with dyspnea, chest pain, cough.  Was in the ED yesterday diagnosed with influenza infection.  Chest x-ray shows viral bronchitis versus reactive airways.  Lung exam without wheezing this time.  Troponin 28 > 27.  Chest pain most likely musculoskeletal. - IV Solu-Medrol  60 twice daily - DuoNebs as needed - Tamiflu   Diabetes mellitus-  - SSI- M - Hold home metformin  - HgbA1c  Hypertension/Hx of SVT-stable. -Resume  Norvasc -valsartan  5/160, metoprolol  50 daily, Imdur  30 daily  COPD/OSA-  - CPAP nightly - DuoNebs as needed  Coronary artery disease-reports stents were placed this past September.  I do not see records to confirm this.  EKG shows LBBB.  Chest pain at this time most likely musculoskeletal.  Troponin 28 > 27.  He has been compliant with ticagrelor  and aspirin . -Resume ticagrelor , aspirin , metoprolol  50 daily, Imdur  30 daily   DVT prophylaxis: Lovenox  Code Status: Full code Family Communication: None at bedside Disposition Plan: ~ 1- 2 days Consults called: None Admission status:  Obs tele    Author: Tully FORBES Carwin, MD 10/24/2024 7:39 PM  For on call review [email or web address].      [1]  Allergies Allergen Reactions   Morphine  Anaphylaxis    Pt states goes manic   Penicillins Anaphylaxis   Ciprofloxacin      Thrush   Cefdinir Itching and Rash   Codeine Anxiety    hyperactive   Sulfa Antibiotics Itching and Rash   "

## 2024-10-24 NOTE — ED Provider Notes (Signed)
 " [place] EMERGENCY DEPARTMENT AT [facility] Provider Note   CSN: [id number] Arrival date & time: 10/24/24  1234     Patient presents with: Influenza (/)   Jacob Orozco is a 63 y.o. male.    Influenza . Patient presents with chest pain and shortness of breath.  Symptoms.  Did have previous stenting.  Seen in the ER yesterday similar symptoms and diagnosed with influenza a.  No shortness of breath.  Has been taking Tylenol .  Sats in the mid 80s at home.  Has been placed on oxygen also had pain and had to take nitroglycerin.     Prior to Admission medications  Medication Sig Start Date End Date Taking? Authorizing Provider  acetaminophen  (TYLENOL ) 500 MG tablet Take 1 tablet (500 mg total) by mouth every 6 (six) hours as needed. 09/14/22   Nivia Colon, PA-C  amitriptyline  (ELAVIL ) 75 MG tablet Take 75 mg by mouth at bedtime as needed for sleep. 11/08/20   [provider]  amLODipine -valsartan  (EXFORGE ) 5-160 MG tablet Take 1 tablet by mouth daily.    [provider]  aspirin  EC 81 MG tablet Take 81 mg by mouth daily. Swallow whole.    [provider]  benzonatate  (TESSALON ) 100 MG capsule Take 2 capsules (200 mg total) by mouth 3 (three) times daily as needed. 10/23/24   Idol, Julie, PA-C  doxycycline  (VIBRAMYCIN ) 100 MG capsule Take 1 capsule (100 mg total) by mouth 2 (two) times daily. One po bid x 7 days 09/14/22   Nivia Colon, PA-C  metFORMIN  (GLUCOPHAGE -XR) 500 MG 24 hr tablet Take 500 mg by mouth in the morning and at bedtime.    [provider]  oseltamivir  (TAMIFLU ) 75 MG capsule Take 1 capsule (75 mg total) by mouth every 12 (twelve) hours. 10/23/24   Idol, Julie, PA-C    Allergies: Morphine , Penicillins, Ciprofloxacin , Cefdinir, Codeine, and Sulfa antibiotics    Review of Systems  Updated Vital Signs BP 122/68   Pulse 92   Temp 99.1 F (37.3 C) (Oral)   Resp (!) 25   Ht 5' 10 (1.778 m)   Wt 93 kg   SpO2 94%    BMI 29.41 kg/m   Physical Exam Vitals reviewed.  HENT:     Nose:     Comments: Nasal cannula oxygen in place. Cardiovascular:     Rate and Rhythm: Regular rhythm.  Abdominal:     Tenderness: There is no abdominal tenderness.  Musculoskeletal:     Cervical back: Neck supple.  Neurological:     Mental Status: He is alert and oriented to person, place, and time.     (all labs ordered are listed, but only abnormal results are displayed) Labs Reviewed  CBC WITH DIFFERENTIAL/PLATELET - Abnormal; Notable for the following components:      Result Value   Monocytes Absolute 1.2 (*)    All other components within normal limits  BASIC METABOLIC PANEL WITH GFR - Abnormal; Notable for the following components:   Sodium 134 (*)    CO2 19 (*)    Glucose, Bld 255 (*)    All other components within normal limits  TROPONIN T, HIGH SENSITIVITY - Abnormal; Notable for the following components:   Troponin T High Sensitivity 28 (*)    All other components within normal limits  TROPONIN T, HIGH SENSITIVITY - Abnormal; Notable for the following components:   Troponin T High Sensitivity 27 (*)    All other components within normal  limits    EKG: EKG Interpretation Date/Time:  Sunday October 24 2024 15:05:50 EST Ventricular Rate:  90 PR Interval:  144 QRS Duration:  142 QT Interval:  379 QTC Calculation: 464 R Axis:   51  Text Interpretation: Sinus rhythm Left bundle branch block rate decreased from yesterday Confirmed by Patsey Lot [phone]) on 10/24/2024 3:07:22 PM  Radiology: DG Chest 2 View Result Date: 10/24/2024 CLINICAL DATA:  Recently diagnosed with the flu presenting with shortness of breath. EXAM: CHEST - 2 VIEW COMPARISON:  None Available. FINDINGS: The heart size and mediastinal contours are within normal limits. There is mild bilateral peribronchial cuffing with increased suprahilar and infrahilar lung markings noted. Mild atelectasis is also seen within the bilateral  lung bases. No pleural effusion or pneumothorax is identified. The visualized skeletal structures are unremarkable. IMPRESSION: 1. Findings consistent with viral bronchitis versus reactive airway disease. 2. Mild bibasilar atelectasis. Electronically Signed   By: Suzen Dials M.D.   On: 10/24/2024 15:40   DG Chest Port 1 View Result Date: 10/23/2024 CLINICAL DATA:  Chest pain and cough. EXAM: PORTABLE CHEST 1 VIEW COMPARISON:  February 06, 2021 FINDINGS: The heart size and mediastinal contours are within normal limits. Mild atelectasis is seen within the right lung base. No pleural effusion or pneumothorax is identified. The visualized skeletal structures are unremarkable. IMPRESSION: Mild right basilar atelectasis. Electronically Signed   By: Suzen Dials M.D.   On: 10/23/2024 13:27     Procedures   Medications Ordered in the ED - No data to display                                  Medical Decision Making Amount and/or Complexity of Data Reviewed Labs: ordered. Radiology: ordered.   Patient with chest pain.  Feels like previous ischemia.  Had taken nitro.  He has improved some.  Had stable troponins yesterday.  However also hypokalemic.  Reportedly hypoxic into the 80s at home.  Patient is not vertical triage area febrile at transfer to room or be started on monitor and EKG.  Reviewed note from yesterday.  Chest x-ray reassuring.  Blood work reassuring.  Troponin slightly elevated but stable.  However with his episode of hypoxia I think would benefit from admission to the hospital.  Will discuss with hospitalist.  States he was not able to sleep last night because he kept waking up short of breath.  Potentially could be episodes of hypoxia      Final diagnoses:  Influenza    ED Discharge Orders     None          Patsey Lot, MD 10/24/24 1755  "

## 2024-10-24 NOTE — ED Notes (Signed)
 Called floor for curiosity call that pt was on the way up.

## 2024-10-25 ENCOUNTER — Encounter (HOSPITAL_COMMUNITY): Payer: Self-pay | Admitting: Internal Medicine

## 2024-10-25 DIAGNOSIS — E1165 Type 2 diabetes mellitus with hyperglycemia: Secondary | ICD-10-CM | POA: Diagnosis not present

## 2024-10-25 DIAGNOSIS — E1100 Type 2 diabetes mellitus with hyperosmolarity without nonketotic hyperglycemic-hyperosmolar coma (NKHHC): Secondary | ICD-10-CM | POA: Diagnosis not present

## 2024-10-25 DIAGNOSIS — J208 Acute bronchitis due to other specified organisms: Secondary | ICD-10-CM | POA: Diagnosis not present

## 2024-10-25 LAB — BASIC METABOLIC PANEL WITH GFR
Anion gap: 20 — ABNORMAL HIGH (ref 5–15)
Anion gap: 23 — ABNORMAL HIGH (ref 5–15)
Anion gap: 18 — ABNORMAL HIGH (ref 5–15)
BUN: 33 mg/dL — ABNORMAL HIGH (ref 8–23)
BUN: 35 mg/dL — ABNORMAL HIGH (ref 8–23)
BUN: 35 mg/dL — ABNORMAL HIGH (ref 8–23)
CO2: 17 mmol/L — ABNORMAL LOW (ref 22–32)
CO2: 13 mmol/L — ABNORMAL LOW (ref 22–32)
CO2: 17 mmol/L — ABNORMAL LOW (ref 22–32)
Calcium: 9.1 mg/dL (ref 8.9–10.3)
Calcium: 8.8 mg/dL — ABNORMAL LOW (ref 8.9–10.3)
Calcium: 9 mg/dL (ref 8.9–10.3)
Chloride: 92 mmol/L — ABNORMAL LOW (ref 98–111)
Chloride: 94 mmol/L — ABNORMAL LOW (ref 98–111)
Chloride: 100 mmol/L (ref 98–111)
Creatinine, Ser: 1.24 mg/dL (ref 0.61–1.24)
Creatinine, Ser: 1.2 mg/dL (ref 0.61–1.24)
Creatinine, Ser: 1.05 mg/dL (ref 0.61–1.24)
GFR, Estimated: >60 mL/min
GFR, Estimated: >60 mL/min
GFR, Estimated: >60 mL/min
Glucose, Bld: 726 mg/dL (ref 70–99)
Glucose, Bld: 757 mg/dL (ref 70–99)
Glucose, Bld: 433 mg/dL — ABNORMAL HIGH (ref 70–99)
Potassium: 4.5 mmol/L (ref 3.5–5.1)
Potassium: 4.6 mmol/L (ref 3.5–5.1)
Potassium: 3.9 mmol/L (ref 3.5–5.1)
Sodium: 129 mmol/L — ABNORMAL LOW (ref 135–145)
Sodium: 130 mmol/L — ABNORMAL LOW (ref 135–145)
Sodium: 135 mmol/L (ref 135–145)

## 2024-10-25 LAB — GLUCOSE, CAPILLARY
Glucose-Capillary: 506 mg/dL (ref 70–99)
Glucose-Capillary: 501 mg/dL (ref 70–99)
Glucose-Capillary: >600 mg/dL (ref 70–99)
Glucose-Capillary: >600 mg/dL (ref 70–99)
Glucose-Capillary: >600 mg/dL (ref 70–99)
Glucose-Capillary: 528 mg/dL (ref 70–99)
Glucose-Capillary: 482 mg/dL — ABNORMAL HIGH (ref 70–99)
Glucose-Capillary: 353 mg/dL — ABNORMAL HIGH (ref 70–99)
Glucose-Capillary: 434 mg/dL — ABNORMAL HIGH (ref 70–99)

## 2024-10-25 LAB — HIV ANTIBODY (ROUTINE TESTING W REFLEX): HIV Screen 4th Generation wRfx: NONREACTIVE

## 2024-10-25 LAB — HEMOGLOBIN A1C
Hgb A1c MFr Bld: 9.2 % — ABNORMAL HIGH (ref 4.8–5.6)
Mean Plasma Glucose: 217.34 mg/dL

## 2024-10-25 LAB — MRSA NEXT GEN BY PCR, NASAL: MRSA by PCR Next Gen: NOT DETECTED

## 2024-10-25 MED ORDER — POTASSIUM CHLORIDE 10 MEQ/100ML IV SOLN
10.0000 meq | INTRAVENOUS | Status: AC
  Administered 2024-10-25: 10 meq via INTRAVENOUS
  Filled 2024-10-25 (×2): qty 100

## 2024-10-25 MED ORDER — ROSUVASTATIN CALCIUM 20 MG PO TABS
20.0000 mg | ORAL_TABLET | Freq: Every day | ORAL | Status: DC
  Administered 2024-10-25 – 2024-10-27 (×3): 20 mg via ORAL
  Filled 2024-10-25 (×3): qty 1

## 2024-10-25 MED ORDER — LACTATED RINGERS IV BOLUS
20.0000 mL/kg | Freq: Once | INTRAVENOUS | Status: AC
  Administered 2024-10-25: 1860 mL via INTRAVENOUS

## 2024-10-25 MED ORDER — CHLORHEXIDINE GLUCONATE CLOTH 2 % EX PADS
6.0000 | MEDICATED_PAD | Freq: Every day | CUTANEOUS | Status: DC
  Administered 2024-10-26 – 2024-10-27 (×2): 6 via TOPICAL

## 2024-10-25 MED ORDER — NITROGLYCERIN 0.4 MG SL SUBL: 0.4000 mg | SUBLINGUAL_TABLET | SUBLINGUAL | Status: DC | PRN

## 2024-10-25 MED ORDER — INSULIN REGULAR(HUMAN) IN NACL 100-0.9 UT/100ML-% IV SOLN
INTRAVENOUS | Status: DC
  Administered 2024-10-25: 20 [IU]/h via INTRAVENOUS
  Administered 2024-10-25: 19 [IU]/h via INTRAVENOUS
  Administered 2024-10-26: 6 [IU]/h via INTRAVENOUS
  Filled 2024-10-25 (×4): qty 100

## 2024-10-25 MED ORDER — LACTATED RINGERS IV BOLUS: 1000.0000 mL | Freq: Once | INTRAVENOUS | Status: DC

## 2024-10-25 MED ORDER — BENZONATATE 100 MG PO CAPS
200.0000 mg | ORAL_CAPSULE | Freq: Three times a day (TID) | ORAL | Status: DC | PRN
  Administered 2024-10-25 – 2024-10-26 (×2): 200 mg via ORAL
  Filled 2024-10-25 (×2): qty 2

## 2024-10-25 MED ORDER — INSULIN GLARGINE 100 UNIT/ML ~~LOC~~ SOLN
15.0000 [IU] | Freq: Every day | SUBCUTANEOUS | Status: DC
  Administered 2024-10-25: 15 [IU] via SUBCUTANEOUS
  Filled 2024-10-25 (×2): qty 0.15

## 2024-10-25 MED ORDER — LACTATED RINGERS IV SOLN: INTRAVENOUS | Status: AC

## 2024-10-25 MED ORDER — INSULIN ASPART 100 UNIT/ML IJ SOLN
20.0000 [IU] | Freq: Once | INTRAMUSCULAR | Status: AC
  Administered 2024-10-25: 20 [IU] via SUBCUTANEOUS

## 2024-10-25 MED ORDER — DEXTROSE 50 % IV SOLN: 0.0000 mL | INTRAVENOUS | Status: DC | PRN

## 2024-10-25 MED ORDER — HYDROXYZINE HCL 10 MG PO TABS
10.0000 mg | ORAL_TABLET | Freq: Every day | ORAL | Status: DC
  Administered 2024-10-25 – 2024-10-27 (×3): 10 mg via ORAL
  Filled 2024-10-25 (×3): qty 1

## 2024-10-25 MED ORDER — EZETIMIBE 10 MG PO TABS
10.0000 mg | ORAL_TABLET | Freq: Every day | ORAL | Status: DC
  Administered 2024-10-25 – 2024-10-27 (×3): 10 mg via ORAL
  Filled 2024-10-25 (×3): qty 1

## 2024-10-25 MED ORDER — LACTATED RINGERS IV SOLN: INTRAVENOUS | Status: DC

## 2024-10-25 MED ORDER — FUROSEMIDE 40 MG PO TABS
40.0000 mg | ORAL_TABLET | Freq: Every day | ORAL | Status: DC
  Administered 2024-10-25: 40 mg via ORAL
  Filled 2024-10-25: qty 1

## 2024-10-25 MED ORDER — GUAIFENESIN-DM 100-10 MG/5ML PO SYRP
5.0000 mL | ORAL_SOLUTION | ORAL | Status: DC | PRN
  Administered 2024-10-26 – 2024-10-27 (×2): 5 mL via ORAL
  Filled 2024-10-25 (×2): qty 5

## 2024-10-25 MED ORDER — INSULIN ASPART 100 UNIT/ML IJ SOLN
20.0000 [IU] | Freq: Once | INTRAMUSCULAR | Status: AC
  Administered 2024-10-25: 20 [IU] via SUBCUTANEOUS
  Filled 2024-10-25: qty 1

## 2024-10-25 MED ORDER — DEXTROSE IN LACTATED RINGERS 5 % IV SOLN: INTRAVENOUS | Status: AC

## 2024-10-25 NOTE — Plan of Care (Signed)

## 2024-10-25 NOTE — Inpatient Diabetes Management (Addendum)
 Inpatient Diabetes Program Recommendations  AACE/ADA: New Consensus Statement on Inpatient Glycemic Control   Target Ranges:  Prepandial:   less than 140 mg/dL      Peak postprandial:   less than 180 mg/dL (1-2 hours)      Critically ill patients:  140 - 180 mg/dL    Latest Reference Range & Units 10/25/24 11:11  Glucose-Capillary 70 - 99 mg/dL 498 (HH)    Latest Reference Range & Units 10/24/24 20:15 10/25/24 07:30  Glucose-Capillary 70 - 99 mg/dL 803 (H) 493 (HH)    Latest Reference Range & Units 10/25/24 05:07  Hemoglobin A1C 4.8 - 5.6 % 9.2 (H)   Review of Glycemic Control  Diabetes history: DM2 Outpatient Diabetes medications: Metformin  XR 500 mg BID, Mounjaro 2.5 mg Qweek Current orders for Inpatient glycemic control: Lantus  15 units daily, Novolog  0-15 units TID with meals, Novolog  0-5 units at bedtime; Solumedrol 60 mg Q12H  Inpatient Diabetes Program Recommendations:    Insulin : Noted CBG 196 mg/dl last night. Patient received Solumedrol 60 mg at 19:45 on 12/28 and 60 mg at 8:09 am today. Steroids are contributing to hyperglycemia. Lantus  15 units given at 9:43 am today and CBG 501 mg/dl at 88:88 today. Noted steroids have been discontinued.  If glucose does not come down with SQ insulin  ordered, may need to consider using IV insulin  for hyperglycemia.  HbgA1C: A1C 9.2% on 10/25/24 indicating an average glucose of 217 mg/dl over the past 2-3 months.  Addendum 10/25/24@11 :10-Spoke with patient about diabetes and home regimen for diabetes control. Patient reports being followed by PCP for diabetes management and currently taking Metformin  XR 500 mg BID and Mounjaro 2.5 mg Qweek (Monday)  as an outpatient for diabetes control. Patient reports he has not taken Mounjaro in about 2 weeks just due to being busy and having so much to do during Christmas.  Patient reports checking glucose 2-3 times per day and that it is usually in the 100-200's mg/dl.  Patient states that his last A1C  was 6.2% in October.  Discussed A1C results (9.2% on 10/25/24) and explained that current A1C indicates an average glucose of 217 mg/dl over the past 2-3 months. Patient states that his current A1C is likely high due to being sick and getting steroids. Explained that the A1C is based on a glucose average over the past 2-3 months.  Discussed glucose and A1C goals. Discussed importance of checking CBGs and maintaining good CBG control to prevent long-term and short-term complications. Patient states that he has all needed medications and supplies at home for DM management. Encouraged patient to follow up with PCP regarding DM control.  Patient verbalized understanding of information discussed and reports no further questions at this time related to diabetes.  Thanks, [name], RN, MSN, CDCES Diabetes Coordinator Inpatient Diabetes Program [phone] (Team Pager from 8am to 5pm)

## 2024-10-25 NOTE — Progress Notes (Signed)
 Went by to check and see if patient was ready to go on CPAP, patient stated he was feeling anxious and wanted to wait a while longer.  RN notified.

## 2024-10-25 NOTE — Progress Notes (Addendum)
 " PROGRESS NOTE    Jacob Orozco  FMW:[id number] DOB: [birth year] DOA: 10/24/2024 PCP: Marchelle Clem Pitts, MD   Brief Narrative:   Jacob Orozco is a 63 y.o. male with medical history significant for coronary artery disease status post stent, COPD, diabetes mellitus, hypertension, OSA on CPAP Patient presented to the ED with complaints of difficulty breathing, cough productive of yellowish sputum, generalized bodyaches that started 3 days ago.  He reports some mild chest pain associated with difficulty breathing, around his chest, worse with coughing.  He got the flu vaccine about 3 weeks ago.   He was in the ED yesterday for similar symptoms, he was diagnosed with the flu, he opted to go home, was started on a course of Tamiflu . He reports since getting home, he has not been able to sleep for several days, he has had poor appetite no vomiting no diarrhea.  Today he checked his O2 sats and they were in the high 80s so he came to the ED.   ED Course: Tmax 99.1.  O2 sats 94 to 100% on room air.  Respiratory rate 23-35.  Blood pressure systolic 122-148. Troponin 28 >> 27. Chest x-ray shows viral bronchitis versus reactive airway disease. Hospitalist to admit for acute bronchitis.   Assessment & Plan:   Principal Problem:   Acute bronchitis, viral Active Problems:   Sleep apnea   Essential hypertension   COPD (chronic obstructive pulmonary disease) (HCC)   Type 2 diabetes mellitus (HCC)   Diabetic neuropathy associated with type 2 diabetes mellitus (HCC)   SVT (supraventricular tachycardia)   Influenza A   Acute bronchitis 2/2 influenza A- reported hypoxia at home, O2 sat > 94% on room air here.  Presenting with dyspnea, chest pain, cough.   Chest x-ray shows viral bronchitis versus reactive airways.  Lung exam without wheezing this time.  Troponin 28 > 27.  Chest pain most likely musculoskeletal with ongoing coughing. - Will continue Tamiflu  -Received IV Solu-Medrol , will  discontinue due to elevated blood sugars more than 500 -Supportive care  Diabetes mellitus-  - SSI- M - Hold home metformin  - HgbA1c repeated at 9.3. -Blood sugars present 500 today likely exacerbated by steroid.  Given Lantus  as well as aspart. -Will discontinue Solu-Medrol  today and reassess in the AM.   Hypertension/Hx of SVT- -history of ablation -Resume Norvasc -valsartan  5/160, metoprolol  50 daily, Imdur  30 daily  History of diastolic dysfunction/chronic lower extremity edema: - Patient states he takes Lasix  at home, does not clear.  Will order for Lasix  40 mg -Offered echocardiogram, however patient would like to do it outpatient with his primary cardiologist if needed Addendum: Patient not able to discharge today because of high blood sugars and continued dyspnea.  Will obtain echocardiogram inpatient.   COPD/OSA-  - CPAP nightly - DuoNebs as needed   Coronary artery disease-reports stents were placed this past September 10th 2025..  I do not see records to confirm this.  EKG shows LBBB.  Chest pain at this time most likely musculoskeletal.  Troponin 28 > 27.  He has been compliant with ticagrelor  and aspirin . -Resume ticagrelor , aspirin , metoprolol  50 daily, Imdur  30 daily     DVT prophylaxis: Lovenox  Code Status: Full code Family Communication: None at bedside Disposition Plan: Discharge home today  consults called: None Admission status:  Obs tele     Subjective:  Patient seen and examined at the bedside.  He feels better.  He is not on any supplemental oxygen.  Feels ready  to go home.  Objective: Vitals:   10/24/24 2134 10/25/24 0046 10/25/24 0404 10/25/24 0807  BP:  135/70 125/79   Pulse:  96 80   Resp:  20 20   Temp:  98.9 F (37.2 C) 98 F (36.7 C)   TempSrc:  Oral Oral   SpO2: 96% 97% 99% 92%  Weight:      Height:        Intake/Output Summary (Last 24 hours) at 10/25/2024 0948 Last data filed at 10/25/2024 0900 Gross per 24 hour  Intake 300 ml   Output --  Net 300 ml   Filed Weights   10/24/24 1412  Weight: 93 kg    Examination:  General: Alert, oriented, sitting on a recliner Neck: normal, supple, no masses, no thyromegaly Respiratory: clear to auscultation bilaterally, no wheezing, no crackles. Normal respiratory effort. No accessory muscle use.  Cardiovascular: Regular rate and rhythm, no murmurs / rubs / gallops. Left LE larger than right -chronic and unchanged from baseline.   Abdomen: no tenderness, no masses palpated. No hepatosplenomegaly.   Musculoskeletal: no clubbing / cyanosis. No joint deformity upper and lower extremities.  Skin: no rashes, lesions, ulcers. No induration Neurologic: No facial asymmetry, moving extremity spontaneously, speech fluent. Psychiatric: Normal judgment and insight. Alert and oriented x 3. Normal mood.   Data Reviewed: I have personally reviewed following labs and imaging studies  CBC: Recent Labs  Lab 10/23/24 1200 10/24/24 1425  WBC 8.9 8.9  NEUTROABS  --  5.7  HGB 13.1 13.1  HCT 39.7 39.4  MCV 87.6 88.7  PLT 161 153   Basic Metabolic Panel: Recent Labs  Lab 10/23/24 1200 10/24/24 1425  NA 135 134*  K 4.6 4.7  CL 100 101  CO2 26 19*  GLUCOSE 275* 255*  BUN 16 23  CREATININE 1.07 1.08  CALCIUM  9.4 9.0   GFR: Estimated Creatinine Clearance: 81.3 mL/min (by C-G formula based on SCr of 1.08 mg/dL). Liver Function Tests: No results for input(s): AST, ALT, ALKPHOS, BILITOT, PROT, ALBUMIN  in the last 168 hours. No results for input(s): LIPASE, AMYLASE in the last 168 hours. No results for input(s): AMMONIA in the last 168 hours. Coagulation Profile: No results for input(s): INR, PROTIME in the last 168 hours. Cardiac Enzymes: No results for input(s): CKTOTAL, CKMB, CKMBINDEX, TROPONINI in the last 168 hours. BNP (last 3 results) No results for input(s): PROBNP in the last 8760 hours. HbA1C: Recent Labs    10/25/24 0507  HGBA1C  9.2*   CBG: Recent Labs  Lab 10/24/24 2015 10/25/24 0730  GLUCAP 196* 506*   Lipid Profile: No results for input(s): CHOL, HDL, LDLCALC, TRIG, CHOLHDL, LDLDIRECT in the last 72 hours. Thyroid Function Tests: No results for input(s): TSH, T4TOTAL, FREET4, T3FREE, THYROIDAB in the last 72 hours. Anemia Panel: No results for input(s): VITAMINB12, FOLATE, FERRITIN, TIBC, IRON, RETICCTPCT in the last 72 hours. Sepsis Labs: No results for input(s): PROCALCITON, LATICACIDVEN in the last 168 hours.  Recent Results (from the past 240 hours)  Resp panel by RT-PCR (RSV, Flu A&B, Covid) Anterior Nasal Swab     Status: Abnormal   Collection Time: 10/23/24 11:47 AM   Specimen: Anterior Nasal Swab  Result Value Ref Range Status   SARS Coronavirus 2 by RT PCR NEGATIVE NEGATIVE Final    Comment: (NOTE) SARS-CoV-2 target nucleic acids are NOT DETECTED.  The SARS-CoV-2 RNA is generally detectable in upper respiratory specimens during the acute phase of infection. The lowest concentration of SARS-CoV-2  viral copies this assay can detect is 138 copies/mL. A negative result does not preclude SARS-Cov-2 infection and should not be used as the sole basis for treatment or other patient management decisions. A negative result may occur with  improper specimen collection/handling, submission of specimen other than nasopharyngeal swab, presence of viral mutation(s) within the areas targeted by this assay, and inadequate number of viral copies(<138 copies/mL). A negative result must be combined with clinical observations, patient history, and epidemiological information. The expected result is Negative.  Fact Sheet for Patients:  bloggercourse.com  Fact Sheet for Healthcare Providers:  seriousbroker.it  This test is no t yet approved or cleared by the [place]  FDA and  has been authorized for detection  and/or diagnosis of SARS-CoV-2 by FDA under an Emergency Use Authorization (EUA). This EUA will remain  in effect (meaning this test can be used) for the duration of the COVID-19 declaration under Section 564(b)(1) of the Act, 21 U.S.C.section 360bbb-3(b)(1), unless the authorization is terminated  or revoked sooner.       Influenza A by PCR POSITIVE (A) NEGATIVE Final   Influenza B by PCR NEGATIVE NEGATIVE Final    Comment: (NOTE) The Xpert Xpress SARS-CoV-2/FLU/RSV plus assay is intended as an aid in the diagnosis of influenza from Nasopharyngeal swab specimens and should not be used as a sole basis for treatment. Nasal washings and aspirates are unacceptable for Xpert Xpress SARS-CoV-2/FLU/RSV testing.  Fact Sheet for Patients: bloggercourse.com  Fact Sheet for Healthcare Providers: seriousbroker.it  This test is not yet approved or cleared by the [place]  FDA and has been authorized for detection and/or diagnosis of SARS-CoV-2 by FDA under an Emergency Use Authorization (EUA). This EUA will remain in effect (meaning this test can be used) for the duration of the COVID-19 declaration under Section 564(b)(1) of the Act, 21 U.S.C. section 360bbb-3(b)(1), unless the authorization is terminated or revoked.     Resp Syncytial Virus by PCR NEGATIVE NEGATIVE Final    Comment: (NOTE) Fact Sheet for Patients: bloggercourse.com  Fact Sheet for Healthcare Providers: seriousbroker.it  This test is not yet approved or cleared by the [place]  FDA and has been authorized for detection and/or diagnosis of SARS-CoV-2 by FDA under an Emergency Use Authorization (EUA). This EUA will remain in effect (meaning this test can be used) for the duration of the COVID-19 declaration under Section 564(b)(1) of the Act, 21 U.S.C. section 360bbb-3(b)(1), unless the authorization is  terminated or revoked.  Performed at [facility], [address], [place], [place] [address]          Radiology Studies: DG Chest 2 View Result Date: 10/24/2024 CLINICAL DATA:  Recently diagnosed with the flu presenting with shortness of breath. EXAM: CHEST - 2 VIEW COMPARISON:  None Available. FINDINGS: The heart size and mediastinal contours are within normal limits. There is mild bilateral peribronchial cuffing with increased suprahilar and infrahilar lung markings noted. Mild atelectasis is also seen within the bilateral lung bases. No pleural effusion or pneumothorax is identified. The visualized skeletal structures are unremarkable. IMPRESSION: 1. Findings consistent with viral bronchitis versus reactive airway disease. 2. Mild bibasilar atelectasis. Electronically Signed   By: Suzen Dials M.D.   On: 10/24/2024 15:40   DG Chest Port 1 View Result Date: 10/23/2024 CLINICAL DATA:  Chest pain and cough. EXAM: PORTABLE CHEST 1 VIEW COMPARISON:  February 06, 2021 FINDINGS: The heart size and mediastinal contours are within normal limits. Mild atelectasis is seen within the right lung base.  No pleural effusion or pneumothorax is identified. The visualized skeletal structures are unremarkable. IMPRESSION: Mild right basilar atelectasis. Electronically Signed   By: Suzen Dials M.D.   On: 10/23/2024 13:27        Scheduled Meds:  amLODipine   5 mg Oral Daily   And   irbesartan   150 mg Oral Daily   aspirin  EC  81 mg Oral Daily   enoxaparin  (LOVENOX ) injection  40 mg Subcutaneous Q24H   feeding supplement  237 mL Oral BID BM   insulin  aspart  0-15 Units Subcutaneous TID WC   insulin  aspart  0-5 Units Subcutaneous QHS   insulin  glargine  15 Units Subcutaneous Daily   ipratropium-albuterol   3 mL Nebulization TID   isosorbide  mononitrate  30 mg Oral Daily   LORazepam   0.5 mg Oral QHS   metoprolol  succinate  50 mg Oral Daily   oseltamivir   75 mg Oral Q12H   ticagrelor   90 mg  Oral BID   Continuous Infusions:        Shemuel Harkleroad, MD [facility] 10/25/2024, 9:48 AM   "

## 2024-10-25 NOTE — Progress Notes (Signed)
 Patient with PMH of MMPs including COPD and OSA on CPAP, placed in OBSERVATION on 12/28 for Viral Bronchitis, currently stable on room air.  Inpatient Care Manager (ICM) conducted chart review and contacted patient by phone to complete brief admission assessment.   Patient is independent and lives at home with Adult daughter, no discharge needs identified at this time. However, IPCM team will continue to follow along and monitor patient advancement through interdisciplinary progression rounds. If new patient transition needs arise, please enter a ICM consult to prompt IPCM team to follow up.     10/25/24 1355  TOC Brief Assessment  Insurance and Status Reviewed  Patient has primary care physician Yes  Home environment has been reviewed Home with Daughter  Prior level of function: Independent  Prior/Current Home Services No current home services  Social Drivers of Health Review SDOH reviewed no interventions necessary  Readmission risk has been reviewed Yes  Transition of care needs no transition of care needs at this time

## 2024-10-25 NOTE — Progress Notes (Signed)
 Patients blood sugars have been high while in my care. This morning, CBG was 506. Provider instructed to admin 20 units of Novolog . Along with Lantus  15 units. Lunch CBG was 501, provider instructed to admin 15 units of Novolog . Dinner CBG machine read Greater than 600. Informed provider, instructed to admin 20 units of Novolog  and STAT BMP. Lab called with critical result of blood sugar of 726. Informed provider of results. Received new orders for Insulin  endotool and IL LR bolus.

## 2024-10-25 NOTE — Progress Notes (Signed)
 Uncontrolled sugars. Labs showing glucose >700, AG 20, Bicarb 17. Will start IVF, IV insulin  per Endotool. Check BHB. Q4h BMP. Transfer to stepdown.

## 2024-10-25 NOTE — Progress Notes (Signed)
" °   10/25/24 2311  BiPAP/CPAP/SIPAP  BiPAP/CPAP/SIPAP Pt Type Adult  BiPAP/CPAP/SIPAP DREAMSTATIOND  Mask Type Full face mask  Dentures removed? Not applicable  Mask Size Medium  Respiratory Rate 25 breaths/min  IPAP 12 cmH20  EPAP 7 cmH2O  Flow Rate 21 lpm  Patient Home Machine No  Patient Home Mask No  Patient Home Tubing No  Auto Titrate No  Device Plugged into RED Power Outlet Yes    "

## 2024-10-26 ENCOUNTER — Other Ambulatory Visit (HOSPITAL_COMMUNITY): Payer: Self-pay | Admitting: *Deleted

## 2024-10-26 ENCOUNTER — Observation Stay (HOSPITAL_COMMUNITY)

## 2024-10-26 DIAGNOSIS — Z8249 Family history of ischemic heart disease and other diseases of the circulatory system: Secondary | ICD-10-CM | POA: Diagnosis not present

## 2024-10-26 DIAGNOSIS — E1140 Type 2 diabetes mellitus with diabetic neuropathy, unspecified: Secondary | ICD-10-CM | POA: Diagnosis present

## 2024-10-26 DIAGNOSIS — Z833 Family history of diabetes mellitus: Secondary | ICD-10-CM | POA: Diagnosis not present

## 2024-10-26 DIAGNOSIS — F17210 Nicotine dependence, cigarettes, uncomplicated: Secondary | ICD-10-CM | POA: Diagnosis present

## 2024-10-26 DIAGNOSIS — E1165 Type 2 diabetes mellitus with hyperglycemia: Secondary | ICD-10-CM | POA: Diagnosis present

## 2024-10-26 DIAGNOSIS — Z7902 Long term (current) use of antithrombotics/antiplatelets: Secondary | ICD-10-CM | POA: Diagnosis not present

## 2024-10-26 DIAGNOSIS — Z823 Family history of stroke: Secondary | ICD-10-CM | POA: Diagnosis not present

## 2024-10-26 DIAGNOSIS — Z7982 Long term (current) use of aspirin: Secondary | ICD-10-CM | POA: Diagnosis not present

## 2024-10-26 DIAGNOSIS — I447 Left bundle-branch block, unspecified: Secondary | ICD-10-CM | POA: Diagnosis present

## 2024-10-26 DIAGNOSIS — J101 Influenza due to other identified influenza virus with other respiratory manifestations: Secondary | ICD-10-CM | POA: Diagnosis present

## 2024-10-26 DIAGNOSIS — Z881 Allergy status to other antibiotic agents status: Secondary | ICD-10-CM | POA: Diagnosis not present

## 2024-10-26 DIAGNOSIS — Z955 Presence of coronary angioplasty implant and graft: Secondary | ICD-10-CM | POA: Diagnosis not present

## 2024-10-26 DIAGNOSIS — T380X5A Adverse effect of glucocorticoids and synthetic analogues, initial encounter: Secondary | ICD-10-CM | POA: Diagnosis present

## 2024-10-26 DIAGNOSIS — I5041 Acute combined systolic (congestive) and diastolic (congestive) heart failure: Secondary | ICD-10-CM | POA: Diagnosis present

## 2024-10-26 DIAGNOSIS — Z79899 Other long term (current) drug therapy: Secondary | ICD-10-CM | POA: Diagnosis not present

## 2024-10-26 DIAGNOSIS — I110 Hypertensive heart disease with heart failure: Secondary | ICD-10-CM | POA: Diagnosis present

## 2024-10-26 DIAGNOSIS — G4733 Obstructive sleep apnea (adult) (pediatric): Secondary | ICD-10-CM | POA: Diagnosis present

## 2024-10-26 DIAGNOSIS — Z882 Allergy status to sulfonamides status: Secondary | ICD-10-CM | POA: Diagnosis not present

## 2024-10-26 DIAGNOSIS — I509 Heart failure, unspecified: Secondary | ICD-10-CM

## 2024-10-26 DIAGNOSIS — Z1152 Encounter for screening for COVID-19: Secondary | ICD-10-CM | POA: Diagnosis not present

## 2024-10-26 DIAGNOSIS — J208 Acute bronchitis due to other specified organisms: Secondary | ICD-10-CM | POA: Diagnosis present

## 2024-10-26 DIAGNOSIS — I2510 Atherosclerotic heart disease of native coronary artery without angina pectoris: Secondary | ICD-10-CM | POA: Diagnosis present

## 2024-10-26 DIAGNOSIS — J440 Chronic obstructive pulmonary disease with acute lower respiratory infection: Secondary | ICD-10-CM | POA: Diagnosis present

## 2024-10-26 DIAGNOSIS — Z7984 Long term (current) use of oral hypoglycemic drugs: Secondary | ICD-10-CM | POA: Diagnosis not present

## 2024-10-26 DIAGNOSIS — E1100 Type 2 diabetes mellitus with hyperosmolarity without nonketotic hyperglycemic-hyperosmolar coma (NKHHC): Secondary | ICD-10-CM

## 2024-10-26 DIAGNOSIS — Z885 Allergy status to narcotic agent status: Secondary | ICD-10-CM | POA: Diagnosis not present

## 2024-10-26 LAB — ECHOCARDIOGRAM COMPLETE
AR max vel: 1.18 cm2
AV Area VTI: 1.19 cm2
AV Area mean vel: 1.2 cm2
AV Mean grad: 8 mmHg
AV Peak grad: 13.7 mmHg
Ao pk vel: 1.85 m/s
Area-P 1/2: 3.31 cm2
Calc EF: 38.2 %
Height: 70 in
S' Lateral: 3.95 cm
Single Plane A2C EF: 41.6 %
Single Plane A4C EF: 38.5 %
Weight: 4195.2 [oz_av]

## 2024-10-26 LAB — BASIC METABOLIC PANEL WITH GFR
Anion gap: 11 (ref 5–15)
Anion gap: 8 (ref 5–15)
BUN: 33 mg/dL — ABNORMAL HIGH (ref 8–23)
BUN: 31 mg/dL — ABNORMAL HIGH (ref 8–23)
CO2: 24 mmol/L (ref 22–32)
CO2: 27 mmol/L (ref 22–32)
Calcium: 9.1 mg/dL (ref 8.9–10.3)
Calcium: 8.7 mg/dL — ABNORMAL LOW (ref 8.9–10.3)
Chloride: 102 mmol/L (ref 98–111)
Chloride: 104 mmol/L (ref 98–111)
Creatinine, Ser: 1.01 mg/dL (ref 0.61–1.24)
Creatinine, Ser: 0.99 mg/dL (ref 0.61–1.24)
GFR, Estimated: >60 mL/min
GFR, Estimated: >60 mL/min
Glucose, Bld: 263 mg/dL — ABNORMAL HIGH (ref 70–99)
Glucose, Bld: 171 mg/dL — ABNORMAL HIGH (ref 70–99)
Potassium: 4.1 mmol/L (ref 3.5–5.1)
Potassium: 4 mmol/L (ref 3.5–5.1)
Sodium: 138 mmol/L (ref 135–145)
Sodium: 139 mmol/L (ref 135–145)

## 2024-10-26 LAB — GLUCOSE, CAPILLARY
Glucose-Capillary: 344 mg/dL — ABNORMAL HIGH (ref 70–99)
Glucose-Capillary: 295 mg/dL — ABNORMAL HIGH (ref 70–99)
Glucose-Capillary: 288 mg/dL — ABNORMAL HIGH (ref 70–99)
Glucose-Capillary: 234 mg/dL — ABNORMAL HIGH (ref 70–99)
Glucose-Capillary: 170 mg/dL — ABNORMAL HIGH (ref 70–99)
Glucose-Capillary: 176 mg/dL — ABNORMAL HIGH (ref 70–99)
Glucose-Capillary: 169 mg/dL — ABNORMAL HIGH (ref 70–99)
Glucose-Capillary: 181 mg/dL — ABNORMAL HIGH (ref 70–99)
Glucose-Capillary: 186 mg/dL — ABNORMAL HIGH (ref 70–99)
Glucose-Capillary: 211 mg/dL — ABNORMAL HIGH (ref 70–99)
Glucose-Capillary: 177 mg/dL — ABNORMAL HIGH (ref 70–99)
Glucose-Capillary: 270 mg/dL — ABNORMAL HIGH (ref 70–99)
Glucose-Capillary: 260 mg/dL — ABNORMAL HIGH (ref 70–99)
Glucose-Capillary: 253 mg/dL — ABNORMAL HIGH (ref 70–99)

## 2024-10-26 LAB — BETA-HYDROXYBUTYRIC ACID: Beta-Hydroxybutyric Acid: 0.13 mmol/L (ref 0.05–0.27)

## 2024-10-26 LAB — OSMOLALITY: Osmolality: 331 mosm/kg (ref 275–295)

## 2024-10-26 MED ORDER — INSULIN GLARGINE-YFGN 100 UNIT/ML ~~LOC~~ SOLN: 40.0000 [IU] | Freq: Every day | SUBCUTANEOUS | Status: DC

## 2024-10-26 MED ORDER — INSULIN ASPART 100 UNIT/ML IJ SOLN
0.0000 [IU] | Freq: Every day | INTRAMUSCULAR | Status: DC
  Administered 2024-10-26: 3 [IU] via SUBCUTANEOUS
  Filled 2024-10-26: qty 1

## 2024-10-26 MED ORDER — PERFLUTREN LIPID MICROSPHERE
1.0000 mL | INTRAVENOUS | Status: AC | PRN
  Administered 2024-10-26: 3 mL via INTRAVENOUS

## 2024-10-26 MED ORDER — INSULIN GLARGINE 100 UNIT/ML ~~LOC~~ SOLN
40.0000 [IU] | Freq: Every day | SUBCUTANEOUS | Status: DC
  Administered 2024-10-26 – 2024-10-27 (×2): 40 [IU] via SUBCUTANEOUS
  Filled 2024-10-26 (×3): qty 0.4

## 2024-10-26 MED ORDER — INSULIN ASPART 100 UNIT/ML IJ SOLN
0.0000 [IU] | Freq: Three times a day (TID) | INTRAMUSCULAR | Status: DC
  Administered 2024-10-26: 4 [IU] via SUBCUTANEOUS
  Administered 2024-10-26 – 2024-10-27 (×2): 11 [IU] via SUBCUTANEOUS
  Filled 2024-10-26 (×2): qty 1

## 2024-10-26 MED ORDER — OXYCODONE HCL 5 MG PO TABS
5.0000 mg | ORAL_TABLET | Freq: Once | ORAL | Status: AC | PRN
  Administered 2024-10-26: 5 mg via ORAL
  Filled 2024-10-26: qty 1

## 2024-10-26 MED ORDER — INSULIN ASPART 100 UNIT/ML IJ SOLN
6.0000 [IU] | Freq: Three times a day (TID) | INTRAMUSCULAR | Status: DC
  Administered 2024-10-26 – 2024-10-27 (×3): 6 [IU] via SUBCUTANEOUS
  Filled 2024-10-26 (×2): qty 1

## 2024-10-26 MED ORDER — INSULIN GLARGINE-YFGN 100 UNIT/ML ~~LOC~~ SOLN: 30.0000 [IU] | Freq: Every day | SUBCUTANEOUS | Status: DC

## 2024-10-26 MED ORDER — IPRATROPIUM-ALBUTEROL 0.5-2.5 (3) MG/3ML IN SOLN
3.0000 mL | Freq: Two times a day (BID) | RESPIRATORY_TRACT | Status: DC
  Administered 2024-10-27: 3 mL via RESPIRATORY_TRACT
  Filled 2024-10-26: qty 3

## 2024-10-26 NOTE — Progress Notes (Signed)
" °   10/26/24 2310  BiPAP/CPAP/SIPAP  BiPAP/CPAP/SIPAP Pt Type Adult  BiPAP/CPAP/SIPAP DREAMSTATIOND  Mask Type Full face mask  Dentures removed? Not applicable  Mask Size Medium  Respiratory Rate 25 breaths/min  IPAP 12 cmH20  EPAP 7 cmH2O  FiO2 (%) 21 %  Patient Home Machine No  Patient Home Mask No  Patient Home Tubing No  Auto Titrate No  Device Plugged into RED Power Outlet Yes  BiPAP/CPAP /SiPAP Vitals  Pulse Rate 92  Resp (!) 25  Bilateral Breath Sounds Diminished;Rhonchi  MEWS Score/Color  MEWS Score 1  MEWS Score Color Green    "

## 2024-10-26 NOTE — Progress Notes (Deleted)
" °   10/26/24 2310  BiPAP/CPAP/SIPAP  BiPAP/CPAP/SIPAP Pt Type Adult  BiPAP/CPAP/SIPAP DREAMSTATIOND  Mask Type Full face mask  Dentures removed? Not applicable  Mask Size Medium  Respiratory Rate 25 breaths/min  IPAP 12 cmH20  EPAP 7 cmH2O  FiO2 (%) 21 %  Patient Home Machine No  Patient Home Mask No  Patient Home Tubing No  Auto Titrate No  Device Plugged into RED Power Outlet Yes  BiPAP/CPAP /SiPAP Vitals  Pulse Rate 92  Resp (!) 25  Bilateral Breath Sounds Clear;Diminished  MEWS Score/Color  MEWS Score 1  MEWS Score Color Green    "

## 2024-10-26 NOTE — Progress Notes (Signed)
 " PROGRESS NOTE    Jacob Orozco  FMW:[id number] DOB: 11-May-1961 DOA: 10/24/2024 PCP: Marchelle Clem Pitts, MD   Brief Narrative:   Jacob Orozco is a 62 y.o. male with medical history significant for coronary artery disease status post stent, COPD, diabetes mellitus, hypertension, OSA on CPAP Patient presented to the ED with complaints of difficulty breathing, cough productive of yellowish sputum, generalized bodyaches that started 3 days ago.  He reports some mild chest pain associated with difficulty breathing, around his chest, worse with coughing.  He got the flu vaccine about 3 weeks ago.   He was in the ED yesterday for similar symptoms, he was diagnosed with the flu, he opted to go home, was started on a course of Tamiflu . He reports since getting home, he has not been able to sleep for several days, he has had poor appetite no vomiting no diarrhea.  Today he checked his O2 sats and they were in the high 80s so he came to the ED.   ED Course: Tmax 99.1.  O2 sats 94 to 100% on room air.  Respiratory rate 23-35.  Blood pressure systolic 122-148. Troponin 28 >> 27. Chest x-ray shows viral bronchitis versus reactive airway disease. Hospitalist to admit for acute bronchitis. Received steroid, hospital course notable for uncontrolled blood sugar more than 700, labs with hyperosmolality, anion gap 23, started on IV insulin    Assessment & Plan:   Principal Problem:   Acute bronchitis, viral Active Problems:   Sleep apnea   Essential hypertension   COPD (chronic obstructive pulmonary disease) (HCC)   Type 2 diabetes mellitus (HCC)   Diabetic neuropathy associated with type 2 diabetes mellitus (HCC)   SVT (supraventricular tachycardia)   Influenza A   Acute bronchitis 2/2 influenza A- reported hypoxia at home, O2 sat > 94% on room air here.  Presenting with dyspnea, chest pain, cough.   Chest x-ray shows viral bronchitis versus reactive airways.  Lung exam without wheezing  this time.  Troponin 28 > 27.  Chest pain most likely musculoskeletal with ongoing coughing. - Will continue Tamiflu  for 5 days -Received IV Solu-Medrol , which was discontinued due to highly elevated blood sugars -Remains without supplemental oxygen.  Uncontrolled diabetes mellitus HHS - Blood sugars more than 700s after admission on 12/29 at which point labs were checked.  Anion gap 23, negative beta hydroxybutyrate, bicarb 17. -This is likely triggered by steroids as well as influenza infection. - Blood sugars improved with IV insulin  overnight.  Anion gap down to 8. - Will switch insulin  to subcu. - Patient takes metformin  and Mounjaro at home.  He says his blood sugars are pretty controlled with them.  Hemoglobin A1c 9.3. - Will monitor blood sugars for the next 24 hours.  May need insulin  on discharge however not sure patient will want that - Diabetic education     Hypertension/Hx of SVT- -history of ablation -Resume Norvasc -valsartan  5/160, metoprolol  50 daily, Imdur  30 daily  History of diastolic dysfunction/chronic lower extremity edema: - Continue DAPT, metoprolol , Imdur  - Patient says he takes Lasix  at home, it is not in the home list - Bilateral lower extremity edema which is chronic. - Echocardiogram has been ordered, pending    COPD/OSA-  - CPAP nightly - DuoNebs as needed   Coronary artery disease-reports stents were placed this past September 10th 2025..  I do not see records to confirm this.  EKG shows LBBB.  Chest pain at this time most likely musculoskeletal.  Troponin 28 >  27.  He has been compliant with ticagrelor  and aspirin . -Resume ticagrelor , aspirin , metoprolol  50 daily, Imdur  30 daily -Echo is pending     DVT prophylaxis: Lovenox  Code Status: Full code Family Communication: None at bedside Disposition Plan: Discharge home  consults called: None Admission status: Inpatient, HHS requiring IV insulin    Subjective:  Patient seen and examined at the  bedside.  Feels better than yesterday.  Blood sugars have been less than 200 with IV insulin  running.  Wants to go home. Says his sugars were messed up due to steroids. Requests that his 12-year-old granddaughter be allowed to visit him   Objective: Vitals:   10/25/24 2300 10/25/24 2353 10/26/24 0400 10/26/24 0700  BP: (!) 126/47  (!) 146/48   Pulse: 91  80   Resp: (!) 23     Temp:  97.9 F (36.6 C) 98.1 F (36.7 C) 97.7 F (36.5 C)  TempSrc:  Axillary Axillary Axillary  SpO2: 96%  97%   Weight:      Height:        Intake/Output Summary (Last 24 hours) at 10/26/2024 1044 Last data filed at 10/26/2024 1017 Gross per 24 hour  Intake 3774.66 ml  Output 2400 ml  Net 1374.66 ml   Filed Weights   10/24/24 1412 10/25/24 1855  Weight: 93 kg 118.9 kg    Examination:  General: Alert, oriented, sitting on a recliner Neck: normal, supple, no masses, no thyromegaly Respiratory: clear to auscultation bilaterally, no wheezing, no crackles. Normal respiratory effort. No accessory muscle use.  Cardiovascular: Regular rate and rhythm, no murmurs / rubs / gallops. Left LE larger than right -chronic and unchanged from baseline.   Abdomen: no tenderness, no masses palpated. No hepatosplenomegaly.   Musculoskeletal: no clubbing / cyanosis. No joint deformity upper and lower extremities.  Skin: no rashes, lesions, ulcers. No induration Neurologic: No facial asymmetry, moving extremity spontaneously, speech fluent. Psychiatric: Normal judgment and insight. Alert and oriented x 3. Normal mood.   Data Reviewed: I have personally reviewed following labs and imaging studies  CBC: Recent Labs  Lab 10/23/24 1200 10/24/24 1425  WBC 8.9 8.9  NEUTROABS  --  5.7  HGB 13.1 13.1  HCT 39.7 39.4  MCV 87.6 88.7  PLT 161 153   Basic Metabolic Panel: Recent Labs  Lab 10/25/24 1707 10/25/24 1904 10/25/24 2236 10/26/24 0241 10/26/24 0618  NA 129* 130* 135 138 139  K 4.5 4.6 3.9 4.1 4.0  CL  92* 94* 100 102 104  CO2 17* 13* 17* 24 27  GLUCOSE 726* 757* 433* 263* 171*  BUN 33* 35* 35* 33* 31*  CREATININE 1.24 1.20 1.05 1.01 0.99  CALCIUM  9.1 8.8* 9.0 9.1 8.7*   GFR: Estimated Creatinine Clearance: 100 mL/min (by C-G formula based on SCr of 0.99 mg/dL). Liver Function Tests: No results for input(s): AST, ALT, ALKPHOS, BILITOT, PROT, ALBUMIN  in the last 168 hours. No results for input(s): LIPASE, AMYLASE in the last 168 hours. No results for input(s): AMMONIA in the last 168 hours. Coagulation Profile: No results for input(s): INR, PROTIME in the last 168 hours. Cardiac Enzymes: No results for input(s): CKTOTAL, CKMB, CKMBINDEX, TROPONINI in the last 168 hours. BNP (last 3 results) No results for input(s): PROBNP in the last 8760 hours. HbA1C: Recent Labs    10/25/24 0507  HGBA1C 9.2*   CBG: Recent Labs  Lab 10/26/24 0550 10/26/24 0649 10/26/24 0759 10/26/24 0903 10/26/24 1007  GLUCAP 176* 169* 181* 186* 211*   Lipid  Profile: No results for input(s): CHOL, HDL, LDLCALC, TRIG, CHOLHDL, LDLDIRECT in the last 72 hours. Thyroid Function Tests: No results for input(s): TSH, T4TOTAL, FREET4, T3FREE, THYROIDAB in the last 72 hours. Anemia Panel: No results for input(s): VITAMINB12, FOLATE, FERRITIN, TIBC, IRON, RETICCTPCT in the last 72 hours. Sepsis Labs: No results for input(s): PROCALCITON, LATICACIDVEN in the last 168 hours.  Recent Results (from the past 240 hours)  Resp panel by RT-PCR (RSV, Flu A&B, Covid) Anterior Nasal Swab     Status: Abnormal   Collection Time: 10/23/24 11:47 AM   Specimen: Anterior Nasal Swab  Result Value Ref Range Status   SARS Coronavirus 2 by RT PCR NEGATIVE NEGATIVE Final    Comment: (NOTE) SARS-CoV-2 target nucleic acids are NOT DETECTED.  The SARS-CoV-2 RNA is generally detectable in upper respiratory specimens during the acute phase of infection. The  lowest concentration of SARS-CoV-2 viral copies this assay can detect is 138 copies/mL. A negative result does not preclude SARS-Cov-2 infection and should not be used as the sole basis for treatment or other patient management decisions. A negative result may occur with  improper specimen collection/handling, submission of specimen other than nasopharyngeal swab, presence of viral mutation(s) within the areas targeted by this assay, and inadequate number of viral copies(<138 copies/mL). A negative result must be combined with clinical observations, patient history, and epidemiological information. The expected result is Negative.  Fact Sheet for Patients:  bloggercourse.com  Fact Sheet for Healthcare Providers:  seriousbroker.it  This test is no t yet approved or cleared by the [place]  FDA and  has been authorized for detection and/or diagnosis of SARS-CoV-2 by FDA under an Emergency Use Authorization (EUA). This EUA will remain  in effect (meaning this test can be used) for the duration of the COVID-19 declaration under Section 564(b)(1) of the Act, 21 U.S.C.section 360bbb-3(b)(1), unless the authorization is terminated  or revoked sooner.       Influenza A by PCR POSITIVE (A) NEGATIVE Final   Influenza B by PCR NEGATIVE NEGATIVE Final    Comment: (NOTE) The Xpert Xpress SARS-CoV-2/FLU/RSV plus assay is intended as an aid in the diagnosis of influenza from Nasopharyngeal swab specimens and should not be used as a sole basis for treatment. Nasal washings and aspirates are unacceptable for Xpert Xpress SARS-CoV-2/FLU/RSV testing.  Fact Sheet for Patients: bloggercourse.com  Fact Sheet for Healthcare Providers: seriousbroker.it  This test is not yet approved or cleared by the [place]  FDA and has been authorized for detection and/or diagnosis of SARS-CoV-2 by FDA  under an Emergency Use Authorization (EUA). This EUA will remain in effect (meaning this test can be used) for the duration of the COVID-19 declaration under Section 564(b)(1) of the Act, 21 U.S.C. section 360bbb-3(b)(1), unless the authorization is terminated or revoked.     Resp Syncytial Virus by PCR NEGATIVE NEGATIVE Final    Comment: (NOTE) Fact Sheet for Patients: bloggercourse.com  Fact Sheet for Healthcare Providers: seriousbroker.it  This test is not yet approved or cleared by the [place]  FDA and has been authorized for detection and/or diagnosis of SARS-CoV-2 by FDA under an Emergency Use Authorization (EUA). This EUA will remain in effect (meaning this test can be used) for the duration of the COVID-19 declaration under Section 564(b)(1) of the Act, 21 U.S.C. section 360bbb-3(b)(1), unless the authorization is terminated or revoked.  Performed at [facility], [address], [place], [place] [address]   MRSA Next Gen by PCR, Nasal  Status: None   Collection Time: 10/25/24  6:45 PM   Specimen: Nasal Mucosa; Nasal Swab  Result Value Ref Range Status   MRSA by PCR Next Gen NOT DETECTED NOT DETECTED Final    Comment: (NOTE) The GeneXpert MRSA Assay (FDA approved for NASAL specimens only), is one component of a comprehensive MRSA colonization surveillance program. It is not intended to diagnose MRSA infection nor to guide or monitor treatment for MRSA infections. Test performance is not FDA approved in patients less than 28 years old. Performed at [facility], [address] [place], [place] [address]          Radiology Studies: DG Chest 2 View Result Date: 10/24/2024 CLINICAL DATA:  Recently diagnosed with the flu presenting with shortness of breath. EXAM: CHEST - 2 VIEW COMPARISON:  None Available. FINDINGS: The heart size and mediastinal contours are within normal limits. There is mild bilateral  peribronchial cuffing with increased suprahilar and infrahilar lung markings noted. Mild atelectasis is also seen within the bilateral lung bases. No pleural effusion or pneumothorax is identified. The visualized skeletal structures are unremarkable. IMPRESSION: 1. Findings consistent with viral bronchitis versus reactive airway disease. 2. Mild bibasilar atelectasis. Electronically Signed   By: Suzen Dials M.D.   On: 10/24/2024 15:40        Scheduled Meds:  amLODipine   5 mg Oral Daily   aspirin  EC  81 mg Oral Daily   Chlorhexidine  Gluconate Cloth  6 each Topical Q0600   enoxaparin  (LOVENOX ) injection  40 mg Subcutaneous Q24H   ezetimibe   10 mg Oral Daily   feeding supplement  237 mL Oral BID BM   hydrOXYzine   10 mg Oral Daily   insulin  aspart  0-20 Units Subcutaneous TID WC   insulin  aspart  0-5 Units Subcutaneous QHS   insulin  aspart  6 Units Subcutaneous TID WC   insulin  glargine  40 Units Subcutaneous Daily   isosorbide  mononitrate  30 mg Oral Daily   metoprolol  succinate  50 mg Oral Daily   oseltamivir   75 mg Oral Q12H   rosuvastatin   20 mg Oral Daily   ticagrelor   90 mg Oral BID   Continuous Infusions:  dextrose  5% lactated ringers  125 mL/hr at 10/26/24 0356   insulin  6.5 Units/hr (10/26/24 0651)   lactated ringers  125 mL/hr at 10/25/24 1900          Derryl Duval, MD [facility] 10/26/2024, 10:44 AM   "

## 2024-10-26 NOTE — Progress Notes (Signed)
*  PRELIMINARY RESULTS* Echocardiogram 2D Echocardiogram has been performed with Definity .  Jacob Orozco 10/26/2024, 4:11 PM

## 2024-10-27 ENCOUNTER — Telehealth (HOSPITAL_COMMUNITY): Payer: Self-pay | Admitting: Pharmacy Technician

## 2024-10-27 ENCOUNTER — Other Ambulatory Visit (HOSPITAL_COMMUNITY): Payer: Self-pay

## 2024-10-27 DIAGNOSIS — J208 Acute bronchitis due to other specified organisms: Secondary | ICD-10-CM | POA: Diagnosis not present

## 2024-10-27 LAB — GLUCOSE, CAPILLARY: Glucose-Capillary: 272 mg/dL — ABNORMAL HIGH (ref 70–99)

## 2024-10-27 LAB — BASIC METABOLIC PANEL WITH GFR
Anion gap: 12 (ref 5–15)
BUN: 23 mg/dL (ref 8–23)
CO2: 24 mmol/L (ref 22–32)
Calcium: 8.7 mg/dL — ABNORMAL LOW (ref 8.9–10.3)
Chloride: 102 mmol/L (ref 98–111)
Creatinine, Ser: 0.9 mg/dL (ref 0.61–1.24)
GFR, Estimated: >60 mL/min
Glucose, Bld: 289 mg/dL — ABNORMAL HIGH (ref 70–99)
Potassium: 4.2 mmol/L (ref 3.5–5.1)
Sodium: 138 mmol/L (ref 135–145)

## 2024-10-27 NOTE — Plan of Care (Signed)
  Problem: Activity: Goal: Risk for activity intolerance will decrease Outcome: Progressing   Problem: Nutrition: Goal: Adequate nutrition will be maintained Outcome: Progressing   Problem: Clinical Measurements: Goal: Ability to maintain clinical measurements within normal limits will improve Outcome: Progressing Goal: Will remain free from infection Outcome: Progressing Goal: Diagnostic test results will improve Outcome: Progressing Goal: Respiratory complications will improve Outcome: Progressing Goal: Cardiovascular complication will be avoided Outcome: Progressing

## 2024-10-27 NOTE — Discharge Summary (Addendum)
 "  DISCHARGE SUMMARY  PEREZ DIRICO  MR#: [id number]  DOB:15-Nov-1960  Date of Admission: 10/24/2024 Date of Discharge: 10/27/2024  Attending Physician:Mariza Bourget ONEIDA Moores, MD  Patient's ERE:[id number], Clem Pitts, MD  Disposition: D/C home   Follow-up Appts:  Follow-up Information     Marchelle Clem Pitts, MD Follow up in 1 week(s).   Specialty: Family Medicine Contact information: [address] [place] [address] [phone]         Your Cardiologist Follow up.   Why: Make sure to keep your appointment with your Cardiologist to discuss your newly appreciated congestive heart failure as we discussed on the day of your discharge.                Tests Needing Follow-up: -evaluation of newly appreciated systolic CHF w/ low EF and titration of GDMT (patient is followed by a Cardiologist in [place], [place])   Discharge Diagnoses: Influenza A with acute bronchitis Newly diagnosed combined diastolic and systolic congestive heart failure - chronic bilateral lower extremity edema - Acute Systolic and Diastolic Heart Failure  Uncontrolled DM2 with hyperglycemia worsened by steroid use HTN History of SVT status post ablation 2023 CAD status post stenting OSA on CPAP  Initial presentation: 63 year old with a history of CAD status post stenting, 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 He had been diagnosed with influenza during an ER visit the day prior and was discharged home on Tamiflu . Unfortunately his symptoms worsened and therefore he returned to the ER on the date of his admission. On return to the ER he was dyspneic and tachypneic with wheezing, though saturations were stable at 94-100% on room air.   Hospital Course:  Influenza A with acute bronchitis Continue Tamiflu  to complete a 5-day  course - received systemic steroids which were discontinued due to severe hyperglycemia - no need for ongoing steroid use at time of d/c    Newly diagnosed combined diastolic and systolic congestive heart failure - chronic bilateral lower extremity edema Reports that he uses Lasix  at home though it is not on his medication list -TTE this admission notes EF 30-35% with regional WMA and grade 1 DD -will benefit from an outpatient cardiology evaluation for possible repeat CAD eval and titration of GDMT for CHF - patient tells me he had a widow maker lesion associated with ACS in November of this year, for which he underwent PTCA/stenting - he reports that he is followed by a Cardiologist in [place], [place] and is due to see them one week from today - I suspect this systolic CHF is due to his recent apparent MI, and that his Cardiologist is aware - nonetheless I discussed the findings of his TTE with him, and educated him on how important outpatient follow up for medication titration was in assuring this is managed properly    Uncontrolled DM2 with hyperglycemia worsened by steroid use Steroids discontinued due to CBGs >700 -utilizes only metformin  and Mounjaro at home -A1c 9.3 - pt is not willing to consider home insulin , and swears that his CBGs are usually about 100 at home - will resume his usual home meds at his insistence     HTN Resume usual home meds and f/u w/ his Cardiologist at discussed above    History of SVT status post ablation 2023   CAD status  post stenting November 2025 Continue DAPT, metoprolol , and Imdur    OSA on CPAP Continue usual home CPAP regimen  Allergies as of 10/27/2024       Reactions   Morphine  Anaphylaxis   Pt states goes manic   Penicillins Anaphylaxis   Ciprofloxacin     Thrush   Cefdinir Itching, Rash   Codeine Anxiety   hyperactive   Sulfa Antibiotics Itching, Rash        Medication List     STOP taking these medications    Plavix 75 MG  tablet Generic drug: clopidogrel       TAKE these medications    acetaminophen  500 MG tablet Commonly known as: TYLENOL  Take 1 tablet (500 mg total) by mouth every 6 (six) hours as needed. What changed:  how much to take reasons to take this   albuterol  108 (90 Base) MCG/ACT inhaler Commonly known as: VENTOLIN  HFA Inhale 2 puffs into the lungs every 6 (six) hours as needed for shortness of breath or wheezing.   amitriptyline  75 MG tablet Commonly known as: ELAVIL  Take 75 mg by mouth at bedtime as needed for sleep.   amLODipine -valsartan  5-160 MG tablet Commonly known as: EXFORGE  Take 1 tablet by mouth daily.   aspirin  EC 81 MG tablet Take 81 mg by mouth daily. Swallow whole.   benzonatate  100 MG capsule Commonly known as: TESSALON  Take 2 capsules (200 mg total) by mouth 3 (three) times daily as needed.   ezetimibe  10 MG tablet Commonly known as: ZETIA  Take 10 mg by mouth daily.   hydrOXYzine  10 MG tablet Commonly known as: ATARAX  Take 10 mg by mouth daily.   isosorbide  mononitrate 30 MG 24 hr tablet Commonly known as: IMDUR  Take 30 mg by mouth daily.   metFORMIN  500 MG 24 hr tablet Commonly known as: GLUCOPHAGE -XR Take 500 mg by mouth in the morning and at bedtime.   metoprolol  succinate 50 MG 24 hr tablet Commonly known as: TOPROL -XL Take 50 mg by mouth daily. Take with or immediately following a meal.   Mounjaro 2.5 MG/0.5ML Pen Generic drug: tirzepatide Inject 2.5 mg into the skin once a week.   nitroGLYCERIN  0.4 MG SL tablet Commonly known as: NITROSTAT  Place 0.4 mg under the tongue every 5 (five) minutes as needed.   oseltamivir  75 MG capsule Commonly known as: TAMIFLU  Take 1 capsule (75 mg total) by mouth every 12 (twelve) hours.   rosuvastatin  20 MG tablet Commonly known as: CRESTOR  Take 20 mg by mouth daily.   ticagrelor  90 MG Tabs tablet Commonly known as: BRILINTA  Take 90 mg by mouth 2 (two) times daily.        Day of  Discharge BP (!) 153/60   Pulse 64   Temp 97.8 F (36.6 C) (Oral)   Resp (!) 21   Ht 5' 10 (1.778 m)   Wt 118.9 kg   SpO2 97%   BMI 37.62 kg/m   Physical Exam: General: No acute respiratory distress Lungs: Clear to auscultation bilaterally without wheezes or crackles Cardiovascular: Regular rate and rhythm without murmur gallop or rub normal S1 and S2 Abdomen: Nontender, nondistended, soft, bowel sounds positive, no rebound, no ascites, no appreciable mass Extremities: No significant cyanosis, clubbing, or edema bilateral lower extremities  Basic Metabolic Panel: Recent Labs  Lab 10/25/24 1904 10/25/24 2236 10/26/24 0241 10/26/24 0618 10/27/24 0425  NA 130* 135 138 139 138  K 4.6 3.9 4.1 4.0 4.2  CL 94* 100 102 104 102  CO2 13* 17*  24 27 24   GLUCOSE 757* 433* 263* 171* 289*  BUN 35* 35* 33* 31* 23  CREATININE 1.20 1.05 1.01 0.99 0.90  CALCIUM  8.8* 9.0 9.1 8.7* 8.7*    CBC: Recent Labs  Lab 10/23/24 1200 10/24/24 1425  WBC 8.9 8.9  NEUTROABS  --  5.7  HGB 13.1 13.1  HCT 39.7 39.4  MCV 87.6 88.7  PLT 161 153     Time spent in discharge (includes decision making & examination of pt): 35 minutes  10/27/2024, 10:27 AM   Reyes IVAR Moores, MD [facility] Office  [phone]      "

## 2024-10-27 NOTE — Telephone Encounter (Signed)
 Patient Product/process Development Scientist completed.    The patient is insured through [facility]. Patient has [employer], may use a copay card, and/or apply for patient assistance if available.    Ran test claim for Lantus  Pen and the current 30 day co-pay is $40.00.  Ran test claim for Humalog KwikPen and the current 30 day co-pay is $40.00.   This test claim was processed through [facility]- copay amounts may vary at other pharmacies due to pharmacy/plan contracts, or as the patient moves through the different stages of their insurance plan.     Reyes Sharps, CPHT Pharmacy Technician Patient Advocate Specialist Lead [facility] Patient Advocate Team Direct Number: [phone]  Fax: [phone]

## 2024-10-27 NOTE — Inpatient Diabetes Management (Signed)
 Inpatient Diabetes Program Recommendations  AACE/ADA: New Consensus Statement on Inpatient Glycemic Control   Target Ranges:  Prepandial:   less than 140 mg/dL      Peak postprandial:   less than 180 mg/dL (1-2 hours)      Critically ill patients:  140 - 180 mg/dL    Latest Reference Range & Units 10/26/24 07:59 10/26/24 09:03 10/26/24 10:07 10/26/24 11:10 10/26/24 14:10 10/26/24 16:11 10/26/24 20:59 10/27/24 07:48  Glucose-Capillary 70 - 99 mg/dL 818 (H) 813 (H) 788 (H) 177 (H) 270 (H) 260 (H) 253 (H) 272 (H)   Review of Glycemic Control  Diabetes history: DM2 Outpatient Diabetes medications: Metformin  XR 500 mg BID, Mounjaro 2.5 mg Qweek (not taken in 2 weeks) Current orders for Inpatient glycemic control: Lantus  40 units daily, Novolog  0-20 units TID with meals, Novolog  0-5 units at bedtime, Novolog  6 units TID with meals  Inpatient Diabetes Program Recommendations:    Insulin : Please consider increasing Lantus  to 45 units daily and meal coverage to Novolog  8 units TID with meals.  HbgA1C: A1C 9.2% on 10/25/24 indicating an average glucose of 217 mg/dl over the past 2-3 months   NOTE: Per Dr. Herschel note on 12/30, patient may need insulin  outpatient. Will plan to talk with patient today regarding possibly using insulin  outpatient.  Thanks, [name], RN, MSN, CDCES Diabetes Coordinator Inpatient Diabetes Program [phone] (Team Pager from 8am to 5pm)

## 2024-10-27 NOTE — Progress Notes (Signed)
 Discharge instructions reviewed with patient. Patient verbalized understanding of instructions and follow up management. Patient discharged home with family in stable condition.

## 2024-10-27 NOTE — Plan of Care (Signed)
" ° °  10/27/2024   To Whom it may concern,  OBRIAN BULSON was hospitalized at [facility] from 10/24/2024 through 10/27/2024  He has been cleared to return to work on but not before 11/01/2024 with no medical restrictions.    Should you have further questions please feel free to contact our office.  Be advised that no medical information will be divulged without a signed HIPA release from the patient.    Sincerely,  Reyes IVAR Moores, MD [facility] Office  [phone]  "
